# Patient Record
Sex: Male | Born: 1950 | Race: White | Hispanic: No | Smoking: Former smoker
Health system: Southern US, Community
[De-identification: ages and names within clinical notes are randomized; demographics above are authoritative.]

## PROBLEM LIST (undated history)

## (undated) DIAGNOSIS — C801 Malignant (primary) neoplasm, unspecified: Secondary | ICD-10-CM

## (undated) DIAGNOSIS — C61 Malignant neoplasm of prostate: Secondary | ICD-10-CM

## (undated) HISTORY — DX: Malignant neoplasm of prostate: C61

---

## 2010-03-25 ENCOUNTER — Ambulatory Visit: Payer: Self-pay | Admitting: Hematology & Oncology

## 2010-04-11 LAB — CBC WITH DIFFERENTIAL (CANCER CENTER ONLY)
BASO%: 2 % (ref 0.0–2.0)
HCT: 47.3 % (ref 38.7–49.9)
LYMPH%: 37.2 % (ref 14.0–48.0)
MCH: 31.9 pg (ref 28.0–33.4)
MCV: 94 fL (ref 82–98)
MONO#: 0.5 10*3/uL (ref 0.1–0.9)
MONO%: 8.4 % (ref 0.0–13.0)
NEUT%: 47.8 % (ref 40.0–80.0)
Platelets: 122 10*3/uL — ABNORMAL LOW (ref 145–400)
RDW: 13.7 % (ref 10.5–14.6)

## 2010-04-12 LAB — COMPREHENSIVE METABOLIC PANEL
Alkaline Phosphatase: 98 U/L (ref 39–117)
CO2: 26 mEq/L (ref 19–32)
Creatinine, Ser: 0.79 mg/dL (ref 0.40–1.50)
Glucose, Bld: 110 mg/dL — ABNORMAL HIGH (ref 70–99)
Sodium: 140 mEq/L (ref 135–145)
Total Bilirubin: 1.3 mg/dL — ABNORMAL HIGH (ref 0.3–1.2)
Total Protein: 7.3 g/dL (ref 6.0–8.3)

## 2010-04-12 LAB — PSA: PSA: 7.08 ng/mL — ABNORMAL HIGH (ref <=4.00)

## 2010-04-12 LAB — LACTATE DEHYDROGENASE: LDH: 222 U/L (ref 94–250)

## 2010-06-21 ENCOUNTER — Encounter: Payer: Self-pay | Admitting: Hematology & Oncology

## 2010-06-27 ENCOUNTER — Ambulatory Visit: Payer: Self-pay | Admitting: Hematology & Oncology

## 2010-11-27 ENCOUNTER — Other Ambulatory Visit: Payer: Self-pay | Admitting: Hematology & Oncology

## 2010-11-27 ENCOUNTER — Encounter (HOSPITAL_BASED_OUTPATIENT_CLINIC_OR_DEPARTMENT_OTHER): Payer: Self-pay | Admitting: Hematology & Oncology

## 2010-11-27 DIAGNOSIS — C61 Malignant neoplasm of prostate: Secondary | ICD-10-CM

## 2010-12-09 ENCOUNTER — Encounter (HOSPITAL_COMMUNITY)
Admission: RE | Admit: 2010-12-09 | Discharge: 2010-12-09 | Disposition: A | Discharge status: Home / Self Care | Payer: Self-pay | Source: Ambulatory Visit | Attending: Hematology & Oncology | Admitting: Hematology & Oncology

## 2010-12-09 ENCOUNTER — Ambulatory Visit (HOSPITAL_COMMUNITY)
Admission: RE | Admit: 2010-12-09 | Discharge: 2010-12-09 | Disposition: A | Discharge status: Home / Self Care | Payer: Self-pay | Source: Ambulatory Visit | Attending: Hematology & Oncology | Admitting: Hematology & Oncology

## 2010-12-09 DIAGNOSIS — C61 Malignant neoplasm of prostate: Secondary | ICD-10-CM | POA: Insufficient documentation

## 2010-12-11 ENCOUNTER — Encounter (HOSPITAL_BASED_OUTPATIENT_CLINIC_OR_DEPARTMENT_OTHER): Payer: Self-pay

## 2010-12-11 ENCOUNTER — Ambulatory Visit (INDEPENDENT_AMBULATORY_CARE_PROVIDER_SITE_OTHER)
Admission: RE | Admit: 2010-12-11 | Discharge: 2010-12-11 | Disposition: A | Discharge status: Home / Self Care | Payer: Self-pay | Source: Ambulatory Visit | Attending: Hematology & Oncology | Admitting: Hematology & Oncology

## 2010-12-11 ENCOUNTER — Ambulatory Visit (HOSPITAL_BASED_OUTPATIENT_CLINIC_OR_DEPARTMENT_OTHER)
Admission: RE | Admit: 2010-12-11 | Discharge: 2010-12-11 | Disposition: A | Discharge status: Home / Self Care | Payer: Self-pay | Source: Ambulatory Visit | Attending: Hematology & Oncology | Admitting: Hematology & Oncology

## 2010-12-11 DIAGNOSIS — C61 Malignant neoplasm of prostate: Secondary | ICD-10-CM

## 2010-12-11 DIAGNOSIS — C7952 Secondary malignant neoplasm of bone marrow: Secondary | ICD-10-CM

## 2010-12-11 DIAGNOSIS — R599 Enlarged lymph nodes, unspecified: Secondary | ICD-10-CM

## 2010-12-11 DIAGNOSIS — K7689 Other specified diseases of liver: Secondary | ICD-10-CM | POA: Insufficient documentation

## 2010-12-11 DIAGNOSIS — K7460 Unspecified cirrhosis of liver: Secondary | ICD-10-CM | POA: Insufficient documentation

## 2010-12-11 DIAGNOSIS — N62 Hypertrophy of breast: Secondary | ICD-10-CM

## 2010-12-11 DIAGNOSIS — C775 Secondary and unspecified malignant neoplasm of intrapelvic lymph nodes: Secondary | ICD-10-CM

## 2010-12-11 DIAGNOSIS — C801 Malignant (primary) neoplasm, unspecified: Secondary | ICD-10-CM

## 2010-12-11 DIAGNOSIS — K8020 Calculus of gallbladder without cholecystitis without obstruction: Secondary | ICD-10-CM | POA: Insufficient documentation

## 2010-12-11 DIAGNOSIS — Z85038 Personal history of other malignant neoplasm of large intestine: Secondary | ICD-10-CM | POA: Insufficient documentation

## 2010-12-11 DIAGNOSIS — C7951 Secondary malignant neoplasm of bone: Secondary | ICD-10-CM

## 2010-12-11 DIAGNOSIS — Z8546 Personal history of malignant neoplasm of prostate: Secondary | ICD-10-CM | POA: Insufficient documentation

## 2010-12-11 DIAGNOSIS — C772 Secondary and unspecified malignant neoplasm of intra-abdominal lymph nodes: Secondary | ICD-10-CM | POA: Insufficient documentation

## 2010-12-11 HISTORY — DX: Malignant (primary) neoplasm, unspecified: C80.1

## 2010-12-11 MED ORDER — IOHEXOL 300 MG/ML  SOLN
100.0000 mL | Freq: Once | INTRAMUSCULAR | Status: AC | PRN
  Administered 2010-12-11: 100 mL via INTRAVENOUS

## 2010-12-16 ENCOUNTER — Other Ambulatory Visit: Payer: Self-pay | Admitting: Hematology & Oncology

## 2010-12-16 ENCOUNTER — Encounter (HOSPITAL_BASED_OUTPATIENT_CLINIC_OR_DEPARTMENT_OTHER): Payer: Self-pay | Admitting: Hematology & Oncology

## 2010-12-16 DIAGNOSIS — R55 Syncope and collapse: Secondary | ICD-10-CM

## 2010-12-16 DIAGNOSIS — I10 Essential (primary) hypertension: Secondary | ICD-10-CM

## 2010-12-16 DIAGNOSIS — C189 Malignant neoplasm of colon, unspecified: Secondary | ICD-10-CM

## 2010-12-16 DIAGNOSIS — C61 Malignant neoplasm of prostate: Secondary | ICD-10-CM

## 2010-12-16 LAB — CBC WITH DIFFERENTIAL (CANCER CENTER ONLY)
Eosinophils Absolute: 0.3 10*3/uL (ref 0.0–0.5)
LYMPH%: 35.7 % (ref 14.0–48.0)
MCH: 33 pg (ref 28.0–33.4)
MCHC: 37.4 g/dL — ABNORMAL HIGH (ref 32.0–35.9)
MCV: 88 fL (ref 82–98)
MONO%: 12.2 % (ref 0.0–13.0)
Platelets: 113 10*3/uL — ABNORMAL LOW (ref 145–400)
RBC: 4.7 10*6/uL (ref 4.20–5.70)

## 2010-12-16 LAB — COMPREHENSIVE METABOLIC PANEL
Alkaline Phosphatase: 113 U/L (ref 39–117)
BUN: 15 mg/dL (ref 6–23)
Glucose, Bld: 126 mg/dL — ABNORMAL HIGH (ref 70–99)
Sodium: 135 mEq/L (ref 135–145)
Total Bilirubin: 1.1 mg/dL (ref 0.3–1.2)
Total Protein: 7 g/dL (ref 6.0–8.3)

## 2010-12-23 ENCOUNTER — Encounter (HOSPITAL_BASED_OUTPATIENT_CLINIC_OR_DEPARTMENT_OTHER): Payer: Self-pay | Admitting: Hematology & Oncology

## 2010-12-23 DIAGNOSIS — C61 Malignant neoplasm of prostate: Secondary | ICD-10-CM

## 2011-02-06 ENCOUNTER — Other Ambulatory Visit: Payer: Self-pay | Admitting: Hematology & Oncology

## 2011-02-06 ENCOUNTER — Encounter (HOSPITAL_BASED_OUTPATIENT_CLINIC_OR_DEPARTMENT_OTHER): Payer: Self-pay | Admitting: Hematology & Oncology

## 2011-02-06 DIAGNOSIS — C61 Malignant neoplasm of prostate: Secondary | ICD-10-CM

## 2011-02-06 LAB — COMPREHENSIVE METABOLIC PANEL
ALT: 66 U/L — ABNORMAL HIGH (ref 0–53)
Albumin: 3.9 g/dL (ref 3.5–5.2)
CO2: 23 mEq/L (ref 19–32)
Calcium: 8.8 mg/dL (ref 8.4–10.5)
Chloride: 104 mEq/L (ref 96–112)
Creatinine, Ser: 0.87 mg/dL (ref 0.50–1.35)
Potassium: 3.8 mEq/L (ref 3.5–5.3)
Total Protein: 6.7 g/dL (ref 6.0–8.3)

## 2011-02-06 LAB — CBC WITH DIFFERENTIAL (CANCER CENTER ONLY)
Eosinophils Absolute: 0.2 10*3/uL (ref 0.0–0.5)
HCT: 42.5 % (ref 38.7–49.9)
LYMPH%: 36.1 % (ref 14.0–48.0)
MCV: 90 fL (ref 82–98)
MONO#: 0.7 10*3/uL (ref 0.1–0.9)
NEUT%: 48.6 % (ref 40.0–80.0)
RBC: 4.75 10*6/uL (ref 4.20–5.70)
WBC: 6.2 10*3/uL (ref 4.0–10.0)

## 2011-02-06 LAB — LACTATE DEHYDROGENASE: LDH: 229 U/L (ref 94–250)

## 2011-03-06 ENCOUNTER — Other Ambulatory Visit: Payer: Self-pay | Admitting: Hematology & Oncology

## 2011-03-06 ENCOUNTER — Encounter (HOSPITAL_BASED_OUTPATIENT_CLINIC_OR_DEPARTMENT_OTHER): Payer: Self-pay | Admitting: Hematology & Oncology

## 2011-03-06 DIAGNOSIS — R55 Syncope and collapse: Secondary | ICD-10-CM

## 2011-03-06 DIAGNOSIS — C61 Malignant neoplasm of prostate: Secondary | ICD-10-CM

## 2011-03-06 LAB — CMP (CANCER CENTER ONLY)
ALT(SGPT): 38 U/L (ref 10–47)
BUN, Bld: 17 mg/dL (ref 7–22)
CO2: 27 mEq/L (ref 18–33)
Creat: 0.7 mg/dl (ref 0.6–1.2)
Total Bilirubin: 1.1 mg/dl (ref 0.20–1.60)

## 2011-03-06 LAB — CBC WITH DIFFERENTIAL (CANCER CENTER ONLY)
BASO%: 0.7 % (ref 0.0–2.0)
EOS%: 4.5 % (ref 0.0–7.0)
HCT: 41.3 % (ref 38.7–49.9)
LYMPH%: 39 % (ref 14.0–48.0)
MCHC: 37 g/dL — ABNORMAL HIGH (ref 32.0–35.9)
MCV: 90 fL (ref 82–98)
MONO#: 0.8 10*3/uL (ref 0.1–0.9)
NEUT%: 44.8 % (ref 40.0–80.0)
RDW: 13.9 % (ref 11.1–15.7)

## 2011-03-06 LAB — PSA: PSA: 1.05 ng/mL (ref <=4.00)

## 2011-04-22 ENCOUNTER — Encounter: Payer: Self-pay | Admitting: Hematology & Oncology

## 2011-04-22 NOTE — Progress Notes (Signed)
Faxed Patient Application Form to [employer] [phone] for Zometa assistance

## 2011-04-29 ENCOUNTER — Telehealth: Payer: Self-pay | Admitting: *Deleted

## 2011-04-29 NOTE — Telephone Encounter (Signed)
[employer] called asking to clarify dosage of Zometa and instructions and quantity of medication.  Asked him to refax the form to [place] for [place] to fill out.

## 2011-04-30 ENCOUNTER — Encounter: Payer: Self-pay | Admitting: *Deleted

## 2011-05-01 ENCOUNTER — Ambulatory Visit (HOSPITAL_BASED_OUTPATIENT_CLINIC_OR_DEPARTMENT_OTHER): Payer: Self-pay | Admitting: Hematology & Oncology

## 2011-05-01 ENCOUNTER — Encounter: Payer: Self-pay | Admitting: Hematology & Oncology

## 2011-05-01 ENCOUNTER — Other Ambulatory Visit: Payer: Self-pay | Admitting: Hematology & Oncology

## 2011-05-01 ENCOUNTER — Other Ambulatory Visit (HOSPITAL_BASED_OUTPATIENT_CLINIC_OR_DEPARTMENT_OTHER): Payer: Self-pay | Admitting: Lab

## 2011-05-01 VITALS — BP 160/92 | HR 77 | Temp 97.0°F | Ht 69.5 in | Wt 322.0 lb

## 2011-05-01 DIAGNOSIS — C61 Malignant neoplasm of prostate: Secondary | ICD-10-CM | POA: Insufficient documentation

## 2011-05-01 DIAGNOSIS — R55 Syncope and collapse: Secondary | ICD-10-CM

## 2011-05-01 DIAGNOSIS — C801 Malignant (primary) neoplasm, unspecified: Secondary | ICD-10-CM

## 2011-05-01 HISTORY — DX: Malignant neoplasm of prostate: C61

## 2011-05-01 LAB — COMPREHENSIVE METABOLIC PANEL
ALT: 30 U/L (ref 0–53)
Alkaline Phosphatase: 94 U/L (ref 39–117)
Creatinine, Ser: 0.98 mg/dL (ref 0.50–1.35)
Sodium: 143 mEq/L (ref 135–145)
Total Bilirubin: 0.9 mg/dL (ref 0.3–1.2)
Total Protein: 6.6 g/dL (ref 6.0–8.3)

## 2011-05-01 LAB — CBC WITH DIFFERENTIAL (CANCER CENTER ONLY)
BASO%: 0.4 % (ref 0.0–2.0)
EOS%: 3.4 % (ref 0.0–7.0)
LYMPH%: 33.4 % (ref 14.0–48.0)
MCH: 33.1 pg (ref 28.0–33.4)
MCHC: 36.2 g/dL — ABNORMAL HIGH (ref 32.0–35.9)
MONO%: 9.4 % (ref 0.0–13.0)
NEUT#: 3.8 10*3/uL (ref 1.5–6.5)
Platelets: 109 10*3/uL — ABNORMAL LOW (ref 145–400)
RBC: 4.53 10*6/uL (ref 4.20–5.70)
RDW: 13.1 % (ref 11.1–15.7)

## 2011-05-01 LAB — PSA: PSA: 3.22 ng/mL (ref <=4.00)

## 2011-05-01 NOTE — Progress Notes (Signed)
This office note has been dictated.

## 2011-05-01 NOTE — Progress Notes (Signed)
CC:   Matthew Lords, MD  DIAGNOSIS:  Metastatic castrate-resistant prostate cancer.  CURRENT THERAPY: 1. Ketoconazole 200 mg p.o. t.i.d. 2. Hydrocortisone 10 mg p.o. daily.  INTERIM HISTORY:  Matthew Lane comes in for his followup.  He continues to do well on the ketoconazole.  He has had no toxicity from it.  He had a good Thanksgiving last week.  He ate well.  He has not had any bowel or bladder issues.  There has been no pain problems.  He does have some chronic knee issues, but this is more osteoarthritis.  When we last saw him in October, his PSA was 1.05.  He has had no bleeding.  There have been no rashes.  He has had no leg swelling.  There has been no cough or shortness of breath.  He has had no dysphasia or odynophagia.  PHYSICAL EXAM:  General:  This is an obese white gentleman in no obvious distress.  Vital Signs:  Temperature 97.8, pulse 72, respiratory rate 18, blood pressure 140/90.  Weight is 322.  Head/Neck:  Normocephalic, atraumatic skull.  There are no ocular or oral lesions.  There are no palpable cervical, or supraclavicular lymph nodes.  Lungs are clear to percussion and auscultation bilaterally.  Cardiac:  Regular rate and rhythm with a normal S1, S2.  There are no murmurs, rubs or bruits. Abdomen: Soft with good bowel sounds.  There is no palpable abdominal mass.  There is no fluid wave.  There is no palpable hepatosplenomegaly. Back:  No tenderness over the spine, ribs, or hips.  Extremities:  Some trace edema in his lower legs which is chronic.  Neurologic:  No focal neurological deficits.  Skin:  No rashes, ecchymosis or petechiae.  LABORATORY STUDIES:  White blood cell count 7.1, hemoglobin 15, hematocrit 41, platelet count 109.  IMPRESSION/PLAN:  Matthew Lane is a 60 year old gentleman with metastatic prostate cancer.  I would say he is castrate resistant.  We will continue to follow his PSA.  He did have a very nice response to the  ketoconazole.  We continues to be asymptomatic.  I still we can hold off on doing scans on him.  I just do not see that we would be gaining any additional information by doing scans when he is asymptomatic with his PSA so low.  I want to see him back now after the holidays.  I think we can probably get him back in January.    ______________________________ Josph Macho, M.D. PRE/MEDQ  D:  05/01/2011  T:  05/01/2011  Job:  [id number]  ADDENDUM:  PSA is 3.22

## 2011-05-01 NOTE — Progress Notes (Signed)
DDS requested patient's records from Jan. 2011 - present for disability.  Faxed to [phone]

## 2011-05-19 ENCOUNTER — Telehealth: Payer: Self-pay | Admitting: *Deleted

## 2011-05-19 NOTE — Telephone Encounter (Signed)
PSA still low per dr. Myna Hidalgo. Called to patient.

## 2011-06-16 ENCOUNTER — Ambulatory Visit: Payer: Self-pay | Admitting: Hematology & Oncology

## 2011-06-16 ENCOUNTER — Telehealth: Payer: Self-pay | Admitting: Hematology & Oncology

## 2011-06-16 ENCOUNTER — Other Ambulatory Visit: Payer: Self-pay | Admitting: Lab

## 2011-06-16 NOTE — Telephone Encounter (Signed)
Pt called and cx 06/16/11 apt due to not feeling well.  He will back to resch.  Nurse was notified of cx apt

## 2011-06-26 NOTE — Telephone Encounter (Signed)
error 

## 2011-08-26 ENCOUNTER — Telehealth: Payer: Self-pay | Admitting: Hematology & Oncology

## 2011-08-26 NOTE — Telephone Encounter (Signed)
Pt made 09-10-11 appointment

## 2011-09-10 ENCOUNTER — Ambulatory Visit: Payer: Self-pay | Admitting: Hematology & Oncology

## 2011-09-10 ENCOUNTER — Other Ambulatory Visit: Payer: Self-pay | Admitting: Lab

## 2011-10-08 ENCOUNTER — Telehealth: Payer: Self-pay | Admitting: Hematology & Oncology

## 2011-10-08 NOTE — Telephone Encounter (Signed)
Pt called left message needing appointment for next week. I called back left him message to call and schedule appointment

## 2011-10-09 ENCOUNTER — Telehealth: Payer: Self-pay | Admitting: Hematology & Oncology

## 2011-10-09 NOTE — Telephone Encounter (Signed)
Pt made 5-15 appointment

## 2011-10-14 ENCOUNTER — Other Ambulatory Visit (HOSPITAL_BASED_OUTPATIENT_CLINIC_OR_DEPARTMENT_OTHER): Payer: Self-pay | Admitting: Lab

## 2011-10-14 ENCOUNTER — Ambulatory Visit (HOSPITAL_BASED_OUTPATIENT_CLINIC_OR_DEPARTMENT_OTHER): Payer: Self-pay | Admitting: Hematology & Oncology

## 2011-10-14 DIAGNOSIS — C61 Malignant neoplasm of prostate: Secondary | ICD-10-CM

## 2011-10-14 LAB — CBC WITH DIFFERENTIAL (CANCER CENTER ONLY)
Eosinophils Absolute: 0.3 10*3/uL (ref 0.0–0.5)
LYMPH%: 34.6 % (ref 14.0–48.0)
MCV: 92 fL (ref 82–98)
MONO#: 0.6 10*3/uL (ref 0.1–0.9)
NEUT#: 2.7 10*3/uL (ref 1.5–6.5)
Platelets: 91 10*3/uL — ABNORMAL LOW (ref 145–400)
RBC: 4.53 10*6/uL (ref 4.20–5.70)
WBC: 5.5 10*3/uL (ref 4.0–10.0)

## 2011-10-14 NOTE — Progress Notes (Signed)
This office note has been dictated.

## 2011-10-15 ENCOUNTER — Telehealth: Payer: Self-pay | Admitting: Hematology & Oncology

## 2011-10-15 LAB — COMPREHENSIVE METABOLIC PANEL
Albumin: 3.9 g/dL (ref 3.5–5.2)
CO2: 25 mEq/L (ref 19–32)
Calcium: 9.6 mg/dL (ref 8.4–10.5)
Chloride: 105 mEq/L (ref 96–112)
Glucose, Bld: 224 mg/dL — ABNORMAL HIGH (ref 70–99)
Potassium: 4 mEq/L (ref 3.5–5.3)
Sodium: 140 mEq/L (ref 135–145)
Total Protein: 6.2 g/dL (ref 6.0–8.3)

## 2011-10-15 LAB — PSA: PSA: 20.44 ng/mL — ABNORMAL HIGH (ref <=4.00)

## 2011-10-15 NOTE — Progress Notes (Signed)
CC:   Matthew Lords, MD  DIAGNOSIS:  Metastatic castrate-resilient prostate cancer.  CURRENT THERAPY: 1. Ketoconazole 200 mg p.o. t.i.d. 2. 2 hydrocortisone 10 mg p.o. daily.  INTERIM HISTORY:  Matthew Lane comes in for his followup.  He has not been seen since November.  He had a missed, I think a couple of his appointments because of family issues.  When we last saw him, his PSA was 3.22.  He has had no problems with the ketoconazole.  He has had no diarrhea. He has had no abdominal pain.  He has had no bony pain.  His appetite has been doing real good.  He has had no cough.  He has had no swallowing difficulties.  He has had no rashes.  PHYSICAL EXAMINATION:  This is an obese white gentleman in no obvious distress.  Vital signs:  Temperature of 96.9, pulse 77, respiratory rate 18, blood pressure 127/82.  Weight is 327.  Head and neck:  A normocephalic, atraumatic skull.  There are no ocular or oral lesions. There are no palpable cervical or supraclavicular lymph nodes.  Lungs: Clear to percussion and auscultation bilaterally.  Cardiac:  Regular rate and rhythm with a normal S1 and S2.  There are no murmurs, rubs or bruits.  Abdomen:  Soft with good bowel sounds.  There is no palpable abdominal mass.  There is no fluid wave.  There is no palpable hepatosplenomegaly.  Back:  No tenderness over the spine, ribs, or hips. Extremities:  No clubbing, cyanosis or edema.  Neurologic:  No focal neurological deficits.  LABORATORY STUDIES:  White cell count 5.5, hemoglobin 14.8, hematocrit 41.5, platelet count 91,000.  IMPRESSION:  Matthew Lane is a 61 year old gentleman with metastatic prostate cancer.  I would still say there is probably some androgen sensitivity to the prostate cancer.  We will have to see what his PSA is now.  I think that if his PSA is up significantly, then we will probably have to re-stage him.  If he does have obvious disease progression, then I would probably  consider him for Zytiga.  I think the FDA has now approved this for up-front therapy for castrate-resistant prostate cancer.  I definitely cannot wait 6 months to get him back in.  I think we need to get him back in 3 months.    ______________________________ Josph Macho, M.D. PRE/MEDQ  D:  10/14/2011  T:  10/15/2011  Job:  [id number]

## 2011-10-15 NOTE — Telephone Encounter (Signed)
Mailed august schedule °

## 2011-10-19 ENCOUNTER — Other Ambulatory Visit: Payer: Self-pay | Admitting: Hematology & Oncology

## 2011-10-19 DIAGNOSIS — C61 Malignant neoplasm of prostate: Secondary | ICD-10-CM

## 2011-10-20 ENCOUNTER — Telehealth: Payer: Self-pay | Admitting: Hematology & Oncology

## 2011-10-20 NOTE — Telephone Encounter (Signed)
Per in basket called Pt to schedule CT and Bone Scan, pt doesn't have insurance and is self pay. He can't afford scans. I offered financial assistance to patient. He will come in Tuesday next week to fill out form. He said he would bring proof of income with him to speed up the process. MD aware.

## 2011-10-29 ENCOUNTER — Telehealth: Payer: Self-pay | Admitting: Hematology & Oncology

## 2011-10-29 NOTE — Telephone Encounter (Signed)
I called patient he said he didn't have proof of income for this year and that the Social [employer] administration was sending him disability income proof and that he would bring it in when it comes in. So he can fill out the financial assistance form.

## 2011-11-04 ENCOUNTER — Telehealth: Payer: Self-pay | Admitting: Hematology & Oncology

## 2011-11-04 NOTE — Telephone Encounter (Signed)
Left pt message to call and update on financial proof to schedule CT and Bone Scan.

## 2011-11-10 ENCOUNTER — Telehealth: Payer: Self-pay | Admitting: Hematology & Oncology

## 2011-11-10 NOTE — Telephone Encounter (Signed)
Left message for patient to call and let me know if he wants to have the scans since according to [facility] there will be a copay.

## 2011-11-12 ENCOUNTER — Telehealth: Payer: Self-pay | Admitting: Hematology & Oncology

## 2011-11-12 NOTE — Telephone Encounter (Signed)
Pt called is aware of 45%co pay. He said he is applying for medicaid and will call me when that's approved.

## 2011-12-17 ENCOUNTER — Telehealth: Payer: Self-pay | Admitting: Hematology & Oncology

## 2011-12-17 NOTE — Telephone Encounter (Signed)
Pt is still waiting on medicaid before he gets his scans done. He said he would call when he finds out something

## 2011-12-31 ENCOUNTER — Telehealth: Payer: Self-pay | Admitting: Hematology & Oncology

## 2011-12-31 NOTE — Telephone Encounter (Signed)
Patient called and cx 01/01/12 appt and resch for 01/19/12

## 2012-01-01 ENCOUNTER — Other Ambulatory Visit: Payer: Self-pay | Admitting: Lab

## 2012-01-01 ENCOUNTER — Ambulatory Visit: Payer: Self-pay | Admitting: Hematology & Oncology

## 2012-01-18 ENCOUNTER — Telehealth: Payer: Self-pay | Admitting: Hematology & Oncology

## 2012-01-18 NOTE — Telephone Encounter (Signed)
Pt called cx 01-19-12 said is having polyop removed Thursday and will call back to reschedule

## 2012-01-18 NOTE — Telephone Encounter (Signed)
MD aware pt cx 8-20 and is having surgery thursday

## 2012-01-19 ENCOUNTER — Ambulatory Visit: Payer: Self-pay | Admitting: Hematology & Oncology

## 2012-01-19 ENCOUNTER — Other Ambulatory Visit: Payer: Self-pay | Admitting: Lab

## 2012-02-02 ENCOUNTER — Telehealth: Payer: Self-pay | Admitting: Hematology & Oncology

## 2012-02-02 NOTE — Telephone Encounter (Signed)
Patient called and resch 01/19/12 appt to 02/25/12

## 2012-02-25 ENCOUNTER — Ambulatory Visit (HOSPITAL_BASED_OUTPATIENT_CLINIC_OR_DEPARTMENT_OTHER): Payer: Medicaid Other | Admitting: Lab

## 2012-02-25 ENCOUNTER — Ambulatory Visit (HOSPITAL_BASED_OUTPATIENT_CLINIC_OR_DEPARTMENT_OTHER): Payer: Medicaid Other | Admitting: Hematology & Oncology

## 2012-02-25 DIAGNOSIS — C61 Malignant neoplasm of prostate: Secondary | ICD-10-CM

## 2012-02-25 LAB — COMPREHENSIVE METABOLIC PANEL
AST: 35 U/L (ref 0–37)
Albumin: 3.9 g/dL (ref 3.5–5.2)
Alkaline Phosphatase: 120 U/L — ABNORMAL HIGH (ref 39–117)
BUN: 10 mg/dL (ref 6–23)
Potassium: 4 mEq/L (ref 3.5–5.3)
Total Bilirubin: 1.3 mg/dL — ABNORMAL HIGH (ref 0.3–1.2)

## 2012-02-25 LAB — CBC WITH DIFFERENTIAL (CANCER CENTER ONLY)
BASO#: 0 10*3/uL (ref 0.0–0.2)
EOS%: 4.8 % (ref 0.0–7.0)
LYMPH%: 40.6 % (ref 14.0–48.0)
MCH: 32.2 pg (ref 28.0–33.4)
MCHC: 36.3 g/dL — ABNORMAL HIGH (ref 32.0–35.9)
MONO%: 11.7 % (ref 0.0–13.0)
NEUT#: 2.5 10*3/uL (ref 1.5–6.5)
Platelets: 98 10*3/uL — ABNORMAL LOW (ref 145–400)

## 2012-02-25 NOTE — Progress Notes (Signed)
This office note has been dictated.

## 2012-02-25 NOTE — Patient Instructions (Signed)
Call if jproblems

## 2012-03-02 ENCOUNTER — Other Ambulatory Visit: Payer: Self-pay | Admitting: Internal Medicine

## 2012-03-03 NOTE — Progress Notes (Signed)
CC:   [name], MD  DIAGNOSIS:  Metastatic prostate cancer, castrate resilient.  CURRENT THERAPY: 1. Ketoconazole 200 mg p.o. t.i.d. 2. Hydrocortisone 10 mg p.o. daily.  INTERIM HISTORY:  Matthew Lane comes in for followup.  We have not seen him since May.  Again, it is issues, I think, with insurance and also with his family.  Unfortunately, he has been seen by Dr. Sabino Gasser of [facility].  I guess he was having some issues with respect to urinating.  He underwent a TURP.  This is done on August 22nd.  The path report ([id number]) shows a Gleason grade 8 tumor.  This certainly is unfortunate.  I think this did help Matthew Lane with urinating.  We tried to get him set up with scans.  Unfortunately again, insurance issues have been a problem.  His last PSA was up to 20.  He is not complaining of any bony pain.  He has had no nausea or vomiting.  He has had no cough.  He has had no headache.  His appetite has been okay.  He has had no diarrhea.  There has been no leg swelling.  PHYSICAL EXAMINATION:  This is a morbidly obese white gentleman in no obvious distress.  Vital signs:  Temperature of 98.3, pulse 85, respiratory rate 22, blood pressure 132/80.  Weight is 329.  Head and neck:  Normocephalic, atraumatic skull.  There are no ocular or oral lesions.  There are no palpable cervical or supraclavicular lymph nodes. Lungs:  Clear bilaterally.  Cardiac:  Regular rate and rhythm with a normal S1 and S2.  There are no murmurs, rubs or bruits.  Abdominal Exam:  Obese but soft.  He has good bowel sounds.  There is no fluid wave.  No palpable hepatosplenomegaly.  Back:  No tenderness over the spine, ribs, or hips.  Extremities:  No clubbing, cyanosis or edema.  He does have some stasis dermatitis changes in his lower legs.  LABORATORY STUDIES:  White cell count 5.8, hemoglobin 15, hematocrit 41, platelet count 98.  His PSA is 30.  His calcium is 8.9 with  an albumin of 3.9.  IMPRESSION:  Matthew Lane is a 62 year old gentleman with metastatic prostate cancer.  It seems that his cancer is progressing.  His PSA is up further.  He had this localized recurrence.  I think we have to see where he stands with respect to his disease.  We need to set him up with CT and bone scan.  I think we are going to have to get him changed with respect to his treatment.  I just do not see that he has responded to the ketoconazole.  Again, my choice would be Zytiga.  I think we are going to have to also convince him to get on some bone medication.  We could either go with Xgeva or Zometa.  Hopefully, things are settled down for him with respect to his home life and that he will be able to come in to see [place] on a more regular basis.    ______________________________ Matthew Lane, M.D. PRE/MEDQ  D:  03/02/2012  T:  03/03/2012  Job:  [id number]

## 2012-03-08 ENCOUNTER — Encounter (HOSPITAL_COMMUNITY)
Admission: RE | Admit: 2012-03-08 | Discharge: 2012-03-08 | Disposition: A | Discharge status: Home / Self Care | Payer: Medicaid Other | Source: Ambulatory Visit | Attending: Hematology & Oncology | Admitting: Hematology & Oncology

## 2012-03-08 ENCOUNTER — Ambulatory Visit (HOSPITAL_COMMUNITY)
Admission: RE | Admit: 2012-03-08 | Discharge: 2012-03-08 | Disposition: A | Discharge status: Home / Self Care | Payer: Medicaid Other | Source: Ambulatory Visit | Attending: Hematology & Oncology | Admitting: Hematology & Oncology

## 2012-03-08 ENCOUNTER — Other Ambulatory Visit: Payer: Self-pay | Admitting: Hematology & Oncology

## 2012-03-08 DIAGNOSIS — C772 Secondary and unspecified malignant neoplasm of intra-abdominal lymph nodes: Secondary | ICD-10-CM | POA: Insufficient documentation

## 2012-03-08 DIAGNOSIS — R599 Enlarged lymph nodes, unspecified: Secondary | ICD-10-CM | POA: Insufficient documentation

## 2012-03-08 DIAGNOSIS — Z79899 Other long term (current) drug therapy: Secondary | ICD-10-CM | POA: Insufficient documentation

## 2012-03-08 DIAGNOSIS — C7951 Secondary malignant neoplasm of bone: Secondary | ICD-10-CM | POA: Insufficient documentation

## 2012-03-08 DIAGNOSIS — C775 Secondary and unspecified malignant neoplasm of intrapelvic lymph nodes: Secondary | ICD-10-CM | POA: Insufficient documentation

## 2012-03-08 DIAGNOSIS — Z980 Intestinal bypass and anastomosis status: Secondary | ICD-10-CM | POA: Insufficient documentation

## 2012-03-08 DIAGNOSIS — R161 Splenomegaly, not elsewhere classified: Secondary | ICD-10-CM | POA: Insufficient documentation

## 2012-03-08 DIAGNOSIS — C61 Malignant neoplasm of prostate: Secondary | ICD-10-CM | POA: Insufficient documentation

## 2012-03-08 DIAGNOSIS — Z85038 Personal history of other malignant neoplasm of large intestine: Secondary | ICD-10-CM | POA: Insufficient documentation

## 2012-03-08 DIAGNOSIS — K8020 Calculus of gallbladder without cholecystitis without obstruction: Secondary | ICD-10-CM | POA: Insufficient documentation

## 2012-03-08 DIAGNOSIS — I700 Atherosclerosis of aorta: Secondary | ICD-10-CM | POA: Insufficient documentation

## 2012-03-08 DIAGNOSIS — C7952 Secondary malignant neoplasm of bone marrow: Secondary | ICD-10-CM | POA: Insufficient documentation

## 2012-03-08 DIAGNOSIS — Z9049 Acquired absence of other specified parts of digestive tract: Secondary | ICD-10-CM | POA: Insufficient documentation

## 2012-03-08 DIAGNOSIS — N200 Calculus of kidney: Secondary | ICD-10-CM | POA: Insufficient documentation

## 2012-03-08 DIAGNOSIS — R972 Elevated prostate specific antigen [PSA]: Secondary | ICD-10-CM | POA: Insufficient documentation

## 2012-03-08 MED ORDER — IOHEXOL 300 MG/ML  SOLN
100.0000 mL | Freq: Once | INTRAMUSCULAR | Status: AC | PRN
  Administered 2012-03-08: 100 mL via INTRAVENOUS

## 2012-03-08 MED ORDER — TECHNETIUM TC 99M MEDRONATE IV KIT
25.0000 | PACK | Freq: Once | INTRAVENOUS | Status: AC | PRN
  Administered 2012-03-08: 25 via INTRAVENOUS

## 2012-03-28 ENCOUNTER — Encounter: Payer: Self-pay | Admitting: Hematology & Oncology

## 2012-03-28 ENCOUNTER — Ambulatory Visit (HOSPITAL_BASED_OUTPATIENT_CLINIC_OR_DEPARTMENT_OTHER): Payer: Medicaid Other | Admitting: Hematology & Oncology

## 2012-03-28 ENCOUNTER — Ambulatory Visit (HOSPITAL_BASED_OUTPATIENT_CLINIC_OR_DEPARTMENT_OTHER): Payer: Medicaid Other

## 2012-03-28 VITALS — BP 129/61 | HR 74 | Temp 97.9°F | Resp 20 | Ht 69.0 in | Wt 333.0 lb

## 2012-03-28 DIAGNOSIS — C61 Malignant neoplasm of prostate: Secondary | ICD-10-CM

## 2012-03-28 DIAGNOSIS — C7951 Secondary malignant neoplasm of bone: Secondary | ICD-10-CM

## 2012-03-28 DIAGNOSIS — C7952 Secondary malignant neoplasm of bone marrow: Secondary | ICD-10-CM

## 2012-03-28 MED ORDER — SODIUM CHLORIDE 0.9 % IJ SOLN
10.0000 mL | INTRAMUSCULAR | Status: DC | PRN
  Administered 2012-03-28: 10 mL
  Filled 2012-03-28: qty 10

## 2012-03-28 MED ORDER — PREDNISONE 10 MG PO TABS: 10.0000 mg | ORAL_TABLET | Freq: Every day | ORAL | Status: DC

## 2012-03-28 MED ORDER — HEPARIN SOD (PORK) LOCK FLUSH 100 UNIT/ML IV SOLN
500.0000 [IU] | Freq: Once | INTRAVENOUS | Status: AC | PRN
  Administered 2012-03-28: 500 [IU]
  Filled 2012-03-28: qty 5

## 2012-03-28 MED ORDER — ABIRATERONE ACETATE 250 MG PO TABS: 1000.0000 mg | ORAL_TABLET | Freq: Every day | ORAL | Status: DC

## 2012-03-28 MED ORDER — ZOLEDRONIC ACID 4 MG/5ML IV CONC
4.0000 mg | Freq: Once | INTRAVENOUS | Status: AC
  Administered 2012-03-28: 4 mg via INTRAVENOUS
  Filled 2012-03-28: qty 5

## 2012-03-28 MED ORDER — SODIUM CHLORIDE 0.9 % IV SOLN
Freq: Once | INTRAVENOUS | Status: AC
  Administered 2012-03-28: 13:00:00 via INTRAVENOUS

## 2012-03-28 NOTE — Patient Instructions (Signed)
Start Taking:  Zytiga 1000 mg - (4) 250 mg tabs per day on an empty stomach. Do not take with food. No food should be eaten 2 hours before and 1 hour after taking Zytiga.  (see print out for further instructions)  Prednisone 10 mg - 1 tablet daily with food.

## 2012-03-28 NOTE — Patient Instructions (Signed)
Zoledronic Acid injection (Hypercalcemia, Oncology) What is this medicine? ZOLEDRONIC ACID (ZOE le dron ik AS id) lowers the amount of calcium loss from bone. It is used to treat too much calcium in your blood from cancer. It is also used to prevent complications of cancer that has spread to the bone. This medicine may be used for other purposes; ask your health care provider or pharmacist if you have questions. What should I tell my health care provider before I take this medicine? They need to know if you have any of these conditions: -aspirin-sensitive asthma -dental disease -kidney disease -an unusual or allergic reaction to zoledronic acid, other medicines, foods, dyes, or preservatives -pregnant or trying to get pregnant -breast-feeding How should I use this medicine? This medicine is for infusion into a vein. It is given by a health care professional in a hospital or clinic setting. Talk to your pediatrician regarding the use of this medicine in children. Special care may be needed. Overdosage: If you think you have taken too much of this medicine contact a poison control center or emergency room at once. NOTE: This medicine is only for you. Do not share this medicine with others. What if I miss a dose? It is important not to miss your dose. Call your doctor or health care professional if you are unable to keep an appointment. What may interact with this medicine? -certain antibiotics given by injection -NSAIDs, medicines for pain and inflammation, like ibuprofen or naproxen -some diuretics like bumetanide, furosemide -teriparatide -thalidomide This list may not describe all possible interactions. Give your health care provider a list of all the medicines, herbs, non-prescription drugs, or dietary supplements you use. Also tell them if you smoke, drink alcohol, or use illegal drugs. Some items may interact with your medicine. What should I watch for while using this medicine? Visit  your doctor or health care professional for regular checkups. It may be some time before you see the benefit from this medicine. Do not stop taking your medicine unless your doctor tells you to. Your doctor may order blood tests or other tests to see how you are doing. Women should inform their doctor if they wish to become pregnant or think they might be pregnant. There is a potential for serious side effects to an unborn child. Talk to your health care professional or pharmacist for more information. You should make sure that you get enough calcium and vitamin D while you are taking this medicine. Discuss the foods you eat and the vitamins you take with your health care professional. Some people who take this medicine have severe bone, joint, and/or muscle pain. This medicine may also increase your risk for a broken thigh bone. Tell your doctor right away if you have pain in your upper leg or groin. Tell your doctor if you have any pain that does not go away or that gets worse. What side effects may I notice from receiving this medicine? Side effects that you should report to your doctor or health care professional as soon as possible: -allergic reactions like skin rash, itching or hives, swelling of the face, lips, or tongue -anxiety, confusion, or depression -breathing problems -changes in vision -feeling faint or lightheaded, falls -jaw burning, cramping, pain -muscle cramps, stiffness, or weakness -trouble passing urine or change in the amount of urine Side effects that usually do not require medical attention (report to your doctor or health care professional if they continue or are bothersome): -bone, joint, or muscle pain -  fever -hair loss -irritation at site where injected -loss of appetite -nausea, vomiting -stomach upset -tired This list may not describe all possible side effects. Call your doctor for medical advice about side effects. You may report side effects to FDA at  1-800-FDA-1088. Where should I keep my medicine? This drug is given in a hospital or clinic and will not be stored at home. NOTE: This sheet is a summary. It may not cover all possible information. If you have questions about this medicine, talk to your doctor, pharmacist, or health care provider.  2012, Elsevier/[employer] Standard. (11/14/2010 9:06:58 AM) 

## 2012-03-28 NOTE — Progress Notes (Signed)
This office note has been dictated.

## 2012-03-28 NOTE — Addendum Note (Signed)
Addended by: Wynonia Hazard on: 03/28/2012 01:26 PM   Modules accepted: Orders

## 2012-03-29 NOTE — Progress Notes (Signed)
CC:   Matthew Lords, MD  DIAGNOSIS:  Metastatic prostate cancer, castrate-resistant.  CURRENT THERAPY: 1. The patient to start Zytiga 1000 mg p.o. daily/prednisone 10 mg     p.o. daily. 2. Patient to start Zometa 4 mg IV monthly.  INTERIM HISTORY:  Matthew Lane comes in for follow-up.  Unfortunately, he is progressing.  His last PSA was up to 29.  We did go ahead and repeat his scans.  His bone scan does show progressive bone metastasis.  CT of the chest, abdomen, and pelvis shows increasing retroperineal lymphadenopathy.  There is no hepatic metastasis.  He does have some changes consistent with cirrhosis.  There is no pulmonary metastasis.  I think we need to change Matthew Lane over to [place].  He was on ketoconazole which had done well for him.  Of note, he also was seen by Dr. [facility]. He had a TURP because of concerns with local recurrence.  He is having no problems urinating right now.  There is no bleeding. There is no dysuria.  He said he has no cough.  No problems with his bowels.  There is no leg swelling.  PHYSICAL EXAMINATION:  General:  This is a morbidly obese white gentleman in no obvious distress.  Vital signs:  97.8, pulse 74, respiratory rate 20, blood pressure 129/61.  Weight is 333.  Head and neck:  Normocephalic, atraumatic skull.  There are no ocular or oral lesions.  There are no palpable cervical or supraclavicular lymph nodes. Lungs:  Clear bilaterally.  Cardiac:  Regular rate and rhythm with a normal S1 and S2.  There are no murmurs, rubs, or bruits.  Abdomen: Obese, but soft.  He has good bowel sounds.  There is no fluid wave. There is no palpable hepatosplenomegaly.  Back:  No tenderness of the spine, ribs, or hips.  Extremities:  No clubbing, cyanosis, or edema. Skin:  No rash, ecchymosis or petechia.  LABORATORY STUDIES:  Pending.  IMPRESSION:  Matthew Lane is a 61 year old gentleman with metastatic prostate cancer.   Again, he is castrate-resistant.  We are going to see about getting him on Zytiga.  We will go ahead and plan to get him back in 1 month.  We will start his __________ today.  Hopefully, we will see a response with his PSA coming down.    ______________________________ Josph Macho, M.D. PRE/MEDQ  D:  03/28/2012  T:  03/29/2012  Job:  [id number]

## 2012-04-27 ENCOUNTER — Ambulatory Visit (HOSPITAL_BASED_OUTPATIENT_CLINIC_OR_DEPARTMENT_OTHER): Payer: Medicaid Other | Admitting: Hematology & Oncology

## 2012-04-27 ENCOUNTER — Other Ambulatory Visit: Payer: Medicaid Other | Admitting: Lab

## 2012-04-27 ENCOUNTER — Ambulatory Visit (HOSPITAL_BASED_OUTPATIENT_CLINIC_OR_DEPARTMENT_OTHER): Payer: Medicaid Other

## 2012-04-27 ENCOUNTER — Other Ambulatory Visit (HOSPITAL_BASED_OUTPATIENT_CLINIC_OR_DEPARTMENT_OTHER): Payer: Medicaid Other

## 2012-04-27 VITALS — BP 142/71 | HR 71 | Temp 98.5°F | Resp 20 | Ht 69.0 in | Wt 330.0 lb

## 2012-04-27 DIAGNOSIS — C61 Malignant neoplasm of prostate: Secondary | ICD-10-CM

## 2012-04-27 DIAGNOSIS — C7951 Secondary malignant neoplasm of bone: Secondary | ICD-10-CM

## 2012-04-27 LAB — CBC WITH DIFFERENTIAL (CANCER CENTER ONLY)
BASO#: 0 10*3/uL (ref 0.0–0.2)
Eosinophils Absolute: 0.2 10*3/uL (ref 0.0–0.5)
HCT: 41.1 % (ref 38.7–49.9)
HGB: 15.1 g/dL (ref 13.0–17.1)
LYMPH%: 27.7 % (ref 14.0–48.0)
MCH: 32.5 pg (ref 28.0–33.4)
MCV: 88 fL (ref 82–98)
MONO%: 10.2 % (ref 0.0–13.0)
NEUT#: 5.2 10*3/uL (ref 1.5–6.5)
NEUT%: 59.9 % (ref 40.0–80.0)
RBC: 4.65 10*6/uL (ref 4.20–5.70)

## 2012-04-27 LAB — CMP (CANCER CENTER ONLY)
Albumin: 3.7 g/dL (ref 3.3–5.5)
Alkaline Phosphatase: 148 U/L — ABNORMAL HIGH (ref 26–84)
BUN, Bld: 12 mg/dL (ref 7–22)
Creat: 0.8 mg/dl (ref 0.6–1.2)
Glucose, Bld: 104 mg/dL (ref 73–118)
Potassium: 4.1 mEq/L (ref 3.3–4.7)

## 2012-04-27 MED ORDER — SODIUM CHLORIDE 0.9 % IV SOLN
Freq: Once | INTRAVENOUS | Status: AC
  Administered 2012-04-27: 10:00:00 via INTRAVENOUS

## 2012-04-27 MED ORDER — SODIUM CHLORIDE 0.9 % IJ SOLN
10.0000 mL | INTRAMUSCULAR | Status: DC | PRN
  Administered 2012-04-27: 10 mL
  Filled 2012-04-27: qty 10

## 2012-04-27 MED ORDER — ALTEPLASE 2 MG IJ SOLR
2.0000 mg | Freq: Once | INTRAMUSCULAR | Status: DC | PRN
  Filled 2012-04-27: qty 2

## 2012-04-27 MED ORDER — ZOLEDRONIC ACID 4 MG/5ML IV CONC
4.0000 mg | Freq: Once | INTRAVENOUS | Status: AC
  Administered 2012-04-27: 4 mg via INTRAVENOUS
  Filled 2012-04-27: qty 5

## 2012-04-27 MED ORDER — HEPARIN SOD (PORK) LOCK FLUSH 100 UNIT/ML IV SOLN
500.0000 [IU] | Freq: Once | INTRAVENOUS | Status: AC | PRN
  Administered 2012-04-27: 500 [IU]
  Filled 2012-04-27: qty 5

## 2012-04-27 NOTE — Patient Instructions (Signed)
Zoledronic Acid injection (Hypercalcemia, Oncology) What is this medicine? ZOLEDRONIC ACID (ZOE le dron ik AS id) lowers the amount of calcium loss from bone. It is used to treat too much calcium in your blood from cancer. It is also used to prevent complications of cancer that has spread to the bone. This medicine may be used for other purposes; ask your health care provider or pharmacist if you have questions. What should I tell my health care provider before I take this medicine? They need to know if you have any of these conditions: -aspirin-sensitive asthma -dental disease -kidney disease -an unusual or allergic reaction to zoledronic acid, other medicines, foods, dyes, or preservatives -pregnant or trying to get pregnant -breast-feeding How should I use this medicine? This medicine is for infusion into a vein. It is given by a health care professional in a hospital or clinic setting. Talk to your pediatrician regarding the use of this medicine in children. Special care may be needed. Overdosage: If you think you have taken too much of this medicine contact a poison control center or emergency room at once. NOTE: This medicine is only for you. Do not share this medicine with others. What if I miss a dose? It is important not to miss your dose. Call your doctor or health care professional if you are unable to keep an appointment. What may interact with this medicine? -certain antibiotics given by injection -NSAIDs, medicines for pain and inflammation, like ibuprofen or naproxen -some diuretics like bumetanide, furosemide -teriparatide -thalidomide This list may not describe all possible interactions. Give your health care provider a list of all the medicines, herbs, non-prescription drugs, or dietary supplements you use. Also tell them if you smoke, drink alcohol, or use illegal drugs. Some items may interact with your medicine. What should I watch for while using this medicine? Visit  your doctor or health care professional for regular checkups. It may be some time before you see the benefit from this medicine. Do not stop taking your medicine unless your doctor tells you to. Your doctor may order blood tests or other tests to see how you are doing. Women should inform their doctor if they wish to become pregnant or think they might be pregnant. There is a potential for serious side effects to an unborn child. Talk to your health care professional or pharmacist for more information. You should make sure that you get enough calcium and vitamin D while you are taking this medicine. Discuss the foods you eat and the vitamins you take with your health care professional. Some people who take this medicine have severe bone, joint, and/or muscle pain. This medicine may also increase your risk for a broken thigh bone. Tell your doctor right away if you have pain in your upper leg or groin. Tell your doctor if you have any pain that does not go away or that gets worse. What side effects may I notice from receiving this medicine? Side effects that you should report to your doctor or health care professional as soon as possible: -allergic reactions like skin rash, itching or hives, swelling of the face, lips, or tongue -anxiety, confusion, or depression -breathing problems -changes in vision -feeling faint or lightheaded, falls -jaw burning, cramping, pain -muscle cramps, stiffness, or weakness -trouble passing urine or change in the amount of urine Side effects that usually do not require medical attention (report to your doctor or health care professional if they continue or are bothersome): -bone, joint, or muscle pain -  fever -hair loss -irritation at site where injected -loss of appetite -nausea, vomiting -stomach upset -tired This list may not describe all possible side effects. Call your doctor for medical advice about side effects. You may report side effects to FDA at  1-800-FDA-1088. Where should I keep my medicine? This drug is given in a hospital or clinic and will not be stored at home. NOTE: This sheet is a summary. It may not cover all possible information. If you have questions about this medicine, talk to your doctor, pharmacist, or health care provider.  2012, Elsevier/[employer] Standard. (11/14/2010 9:06:58 AM) 

## 2012-04-27 NOTE — Progress Notes (Signed)
This office note has been dictated.

## 2012-04-28 NOTE — Progress Notes (Signed)
CC:   Matthew Lords, MD  DIAGNOSIS:  Metastatic prostate cancer-castrate resistant.  CURRENT THERAPY: 1. Zytiga 1000 mg p.o. daily with prednisone 10 mg p.o. daily. 2. Zometa 4 mg IV monthly.  INTERIM HISTORY:  Matthew Lane comes in for his followup.  He says he feels better.  His bones do not hurt as much.  Hopefully, this may be from the Zometa we gave him.  He has not had any problems with the Zytiga.  There has been no diarrhea.  He has had no nausea or vomiting.  He has had no cough.  When we started his Zytiga, his PSA was 29. physical exam  PHYSICAL EXAMINATION:  General:  This is an obese white gentleman in no obvious distress.  Vital signs:  Temperature of 98.4, pulse 71, respiratory rate 20, blood pressure 142/71.  Weight is 330.  Head and neck:  Normocephalic, atraumatic skull.  There are no ocular or oral lesions.  There are no palpable cervical or supraclavicular lymph nodes. Lungs:  Clear bilaterally.  Cardiac:  Regular rate and rhythm with a normal S1 and S2.  There are no murmurs, rubs, or bruits.  Abdomen: Soft with good bowel sounds.  There is no palpable abdominal mass.  He is obese.  There is no palpable hepatosplenomegaly.  Extremities:  Some mild nonpitting edema of his legs.  There may be some slight stasis dermatitis changes in his legs.  Skin:  No rashes outside of that with his legs.  LABORATORY STUDIES:  White cell count is 8.6, hemoglobin 15, hematocrit 41, platelet count 88.  IMPRESSION:  Matthew Lane is a 61 year old white gentleman with metastatic prostate cancer.  He is castrate resistant.  We have him on Zytiga.  He has been on Zytiga now for almost 2 months.  We will continue with the Zometa monthly.  He says he does get up every morning at 3 o'clock to go to the bathroom. This is when he takes his Zytiga.  Again, we will get him back in 1 month.  We will see how his PSA looks. I think if his PSA is responding, then we do not need to worry  about doing scans on him.   ______________________________ Matthew Lane, M.D. PRE/MEDQ  D:  04/27/2012  T:  04/28/2012  Job:  [id number]

## 2012-05-05 ENCOUNTER — Telehealth: Payer: Self-pay | Admitting: *Deleted

## 2012-05-05 NOTE — Telephone Encounter (Signed)
Message copied by Anselm Jungling on Thu May 05, 2012 10:00 AM ------      Message from: Josph Macho      Created: Tue May 03, 2012  8:36 PM       Call -PSA down to 1.3 from 29!!!!  Fantastic!!!!!  Cindee Lame

## 2012-05-05 NOTE — Telephone Encounter (Signed)
Called patient to let him know that his PSA is down to 1.3 from 29 per dr. Myna Hidalgo

## 2012-05-11 ENCOUNTER — Other Ambulatory Visit: Payer: Self-pay | Admitting: *Deleted

## 2012-05-11 DIAGNOSIS — C61 Malignant neoplasm of prostate: Secondary | ICD-10-CM

## 2012-05-11 MED ORDER — ABIRATERONE ACETATE 250 MG PO TABS: 1000.0000 mg | ORAL_TABLET | Freq: Every day | ORAL | Status: DC

## 2012-05-11 NOTE — Telephone Encounter (Signed)
Pt is needing another rx to be sent off with his pt assistance application for Zytiga.

## 2012-06-08 ENCOUNTER — Ambulatory Visit: Payer: Medicaid Other | Admitting: Medical

## 2012-06-08 ENCOUNTER — Ambulatory Visit: Payer: Medicaid Other

## 2012-06-08 ENCOUNTER — Other Ambulatory Visit: Payer: Medicaid Other | Admitting: Lab

## 2012-06-10 ENCOUNTER — Ambulatory Visit: Payer: Medicaid Other | Admitting: Medical

## 2012-06-10 ENCOUNTER — Ambulatory Visit: Payer: Medicaid Other

## 2012-06-10 ENCOUNTER — Other Ambulatory Visit: Payer: Medicaid Other | Admitting: Lab

## 2012-06-13 ENCOUNTER — Telehealth: Payer: Self-pay | Admitting: Hematology & Oncology

## 2012-06-13 NOTE — Telephone Encounter (Signed)
Left message informing patient of change in time for 06/14/12 appointment.  Asked patient to call back and confirm.  Changed per Tracy's request (scheduling conflict).

## 2012-06-13 NOTE — Telephone Encounter (Signed)
Patient called and resch 06/10/12 cx apt to 06/14/12

## 2012-06-14 ENCOUNTER — Ambulatory Visit: Payer: Medicaid Other

## 2012-06-14 ENCOUNTER — Other Ambulatory Visit: Payer: Medicaid Other | Admitting: Lab

## 2012-06-14 ENCOUNTER — Other Ambulatory Visit (HOSPITAL_BASED_OUTPATIENT_CLINIC_OR_DEPARTMENT_OTHER): Payer: Self-pay | Admitting: Lab

## 2012-06-14 ENCOUNTER — Ambulatory Visit (HOSPITAL_BASED_OUTPATIENT_CLINIC_OR_DEPARTMENT_OTHER): Payer: Self-pay | Admitting: Medical

## 2012-06-14 ENCOUNTER — Ambulatory Visit: Payer: Medicaid Other | Admitting: Medical

## 2012-06-14 ENCOUNTER — Ambulatory Visit (HOSPITAL_BASED_OUTPATIENT_CLINIC_OR_DEPARTMENT_OTHER): Payer: Self-pay

## 2012-06-14 VITALS — BP 139/69 | HR 78 | Temp 98.1°F | Resp 20 | Ht 69.0 in | Wt 329.0 lb

## 2012-06-14 DIAGNOSIS — C7951 Secondary malignant neoplasm of bone: Secondary | ICD-10-CM

## 2012-06-14 DIAGNOSIS — C61 Malignant neoplasm of prostate: Secondary | ICD-10-CM

## 2012-06-14 DIAGNOSIS — C7952 Secondary malignant neoplasm of bone marrow: Secondary | ICD-10-CM

## 2012-06-14 LAB — PSA: PSA: 0.37 ng/mL (ref <=4.00)

## 2012-06-14 LAB — CBC WITH DIFFERENTIAL (CANCER CENTER ONLY)
BASO%: 0.4 % (ref 0.0–2.0)
EOS%: 1.9 % (ref 0.0–7.0)
Eosinophils Absolute: 0.2 10*3/uL (ref 0.0–0.5)
MCH: 32.7 pg (ref 28.0–33.4)
MCHC: 36.2 g/dL — ABNORMAL HIGH (ref 32.0–35.9)
MONO%: 10.9 % (ref 0.0–13.0)
NEUT#: 5 10*3/uL (ref 1.5–6.5)
Platelets: 121 10*3/uL — ABNORMAL LOW (ref 145–400)
RBC: 4.71 10*6/uL (ref 4.20–5.70)
RDW: 13.6 % (ref 11.1–15.7)

## 2012-06-14 LAB — CMP (CANCER CENTER ONLY)
ALT(SGPT): 19 U/L (ref 10–47)
AST: 27 U/L (ref 11–38)
Alkaline Phosphatase: 93 U/L — ABNORMAL HIGH (ref 26–84)
Creat: 0.9 mg/dl (ref 0.6–1.2)
Total Bilirubin: 1.6 mg/dl (ref 0.20–1.60)

## 2012-06-14 LAB — LACTATE DEHYDROGENASE: LDH: 210 U/L (ref 94–250)

## 2012-06-14 MED ORDER — SODIUM CHLORIDE 0.9 % IJ SOLN
10.0000 mL | INTRAMUSCULAR | Status: DC | PRN
  Administered 2012-06-14: 10 mL
  Filled 2012-06-14: qty 10

## 2012-06-14 MED ORDER — ZOLEDRONIC ACID 4 MG/100ML IV SOLN
4.0000 mg | Freq: Once | INTRAVENOUS | Status: AC
  Administered 2012-06-14: 4 mg via INTRAVENOUS
  Filled 2012-06-14: qty 100

## 2012-06-14 MED ORDER — SODIUM CHLORIDE 0.9 % IV SOLN
Freq: Once | INTRAVENOUS | Status: AC
  Administered 2012-06-14: 11:00:00 via INTRAVENOUS

## 2012-06-14 MED ORDER — ALTEPLASE 2 MG IJ SOLR
2.0000 mg | Freq: Once | INTRAMUSCULAR | Status: DC | PRN
  Filled 2012-06-14: qty 2

## 2012-06-14 MED ORDER — HEPARIN SOD (PORK) LOCK FLUSH 100 UNIT/ML IV SOLN
250.0000 [IU] | Freq: Once | INTRAVENOUS | Status: DC | PRN
  Filled 2012-06-14: qty 5

## 2012-06-14 MED ORDER — SODIUM CHLORIDE 0.9 % IJ SOLN
3.0000 mL | Freq: Once | INTRAMUSCULAR | Status: DC | PRN
  Filled 2012-06-14: qty 10

## 2012-06-14 MED ORDER — HEPARIN SOD (PORK) LOCK FLUSH 100 UNIT/ML IV SOLN
500.0000 [IU] | Freq: Once | INTRAVENOUS | Status: AC | PRN
  Administered 2012-06-14: 500 [IU]
  Filled 2012-06-14: qty 5

## 2012-06-14 NOTE — Progress Notes (Signed)
Diagnosis: Metastatic prostate cancer-castrate resistant.  Current therapy: #1    Zytiga 1000 mg by mouth daily with prednisone 10 mg po daily #2.  Zometa 4 mg IV monthly.  Interim history: Mr. Driskill presents today for an office followup visit.  Overall, he, reports, that he is doing quite well.  He is tolerating the Zytiga without any major toxicity.  He, reports, that he feels, good.  He has good energy.  He does not report any excessive fatigue or weakness.  He does not report any nausea, vomiting, diarrhea, constipation.  He does not report any, fevers, chills, or, night sweats.  He's not reporting any cough, chest pain, shortness of breath.  He denies any mouth sores.  He denies any lower leg swelling.  He denies any abdominal pain, or bone pain.  He denies any headaches, visual changes, or rashes.  He denies any hand or foot syndrome.  He denies any skin problems.  He remains on Zometa 4 mg monthly.  Overall, he is able to perform his activities of daily living without any hindrance or decline.  He has had a very nice response.  In terms of his PSA.  His PSA in September, was 29.2.  In November.  It dropped to 1.26.   Review of Systems: Constitutional:Negative for malaise/fatigue, fever, chills, weight loss, diaphoresis, activity change, appetite change, and unexpected weight change.  HEENT: Negative for double vision, blurred vision, visual loss, ear pain, tinnitus, congestion, rhinorrhea, epistaxis sore throat or sinus disease, oral pain/lesion, tongue soreness Respiratory: Negative for cough, chest tightness, shortness of breath, wheezing and stridor.  Cardiovascular: Negative for chest pain, palpitations, leg swelling, orthopnea, PND, DOE or claudication Gastrointestinal: Negative for nausea, vomiting, abdominal pain, diarrhea, constipation, blood in stool, melena, hematochezia, abdominal distention, anal bleeding, rectal pain, anorexia and hematemesis.  Genitourinary: Negative for dysuria,  frequency, hematuria,  Musculoskeletal: Negative for myalgias, back pain, joint swelling, arthralgias and gait problem.  Skin: Negative for rash, color change, pallor and wound.  Neurological:. Negative for dizziness/light-headedness, tremors, seizures, syncope, facial asymmetry, speech difficulty, weakness, numbness, headaches and paresthesias.  Hematological: Negative for adenopathy. Does not bruise/bleed easily.  Psychiatric/Behavioral:  Negative for depression, no loss of interest in normal activity or change in sleep pattern.   Physical Exam: This is a pleasant, obese, 62 year old, white, male, in no obvious distress Vitals: Temperature 98.1 degrees, pulse 70, respirations 20, blood pressure 136/69, weight 329 pounds HEENT reveals a normocephalic, atraumatic skull, no scleral icterus, no oral lesions  Neck is supple without any cervical or supraclavicular adenopathy.  Lungs are clear to auscultation bilaterally. There are no wheezes, rales or rhonci Cardiac is regular rate and rhythm with a normal S1 and S2. There are no murmurs, rubs, or bruits.  Abdomen is soft with good bowel sounds, there is no palpable mass. There is no palpable hepatosplenomegaly. There is no palpable fluid wave.  Musculoskeletal no tenderness of the spine, ribs, or hips.  Extremities there are no clubbing, cyanosis, or edema.  Skin no petechia, purpura or ecchymosis Neurologic is nonfocal.  Laboratory Data: White count 9.9, hemoglobin 15.4, hematocrit 42.6, platelets 121,000  Current Outpatient Prescriptions on File Prior to Visit  Medication Sig Dispense Refill  . abiraterone Acetate (ZYTIGA) 250 MG tablet Take 4 tablets (1,000 mg total) by mouth daily. Take on an empty stomach 1 hour before or 2 hours after a meal  120 tablet  5  . aspirin 81 MG tablet Take 81 mg by mouth daily.      Marland Kitchen  Calcium-Vitamin D 150-100 MG-UNIT TABS Take by mouth every morning.      . Cyanocobalamin (VITAMIN B 12 PO) Take by mouth 2  (two) times daily.      . finasteride (PROSCAR) 5 MG tablet Take 5 mg by mouth daily. Pt not sure of dose      . multivitamin (THERAGRAN) per tablet Take 1 tablet by mouth daily.        . predniSONE (DELTASONE) 10 MG tablet Take 1 tablet (10 mg total) by mouth daily.  30 tablet  2   Assessment/Plan: This is a pleasant, 62 year old, white male,  with the following issues:  #1.  Metastatic prostate cancer-castrate resistant.  He is tolerating the Zytiga extremely well.  He has been on it now for almost 3 months.  His PSA has responded quite nicely.  He will continue on the Zytiga.   #2.  Supportive therapy.  He will continue to receive Zometa 4 mg IV monthly.  He is tolerating the Zometa quite well without any toxicity.  He knows the signs and symptoms to watch for in terms of osteonecrosis of the jaw.  #3.  Followup.  Mr. Larusso will follow back up with [place] in one month, but before then should there be questions or concerns.

## 2012-06-14 NOTE — Patient Instructions (Signed)
Zoledronic Acid injection (Hypercalcemia, Oncology) What is this medicine? ZOLEDRONIC ACID (ZOE le dron ik AS id) lowers the amount of calcium loss from bone. It is used to treat too much calcium in your blood from cancer. It is also used to prevent complications of cancer that has spread to the bone. This medicine may be used for other purposes; ask your health care provider or pharmacist if you have questions. What should I tell my health care provider before I take this medicine? They need to know if you have any of these conditions: -aspirin-sensitive asthma -dental disease -kidney disease -an unusual or allergic reaction to zoledronic acid, other medicines, foods, dyes, or preservatives -pregnant or trying to get pregnant -breast-feeding How should I use this medicine? This medicine is for infusion into a vein. It is given by a health care professional in a hospital or clinic setting. Talk to your pediatrician regarding the use of this medicine in children. Special care may be needed. Overdosage: If you think you have taken too much of this medicine contact a poison control center or emergency room at once. NOTE: This medicine is only for you. Do not share this medicine with others. What if I miss a dose? It is important not to miss your dose. Call your doctor or health care professional if you are unable to keep an appointment. What may interact with this medicine? -certain antibiotics given by injection -NSAIDs, medicines for pain and inflammation, like ibuprofen or naproxen -some diuretics like bumetanide, furosemide -teriparatide -thalidomide This list may not describe all possible interactions. Give your health care provider a list of all the medicines, herbs, non-prescription drugs, or dietary supplements you use. Also tell them if you smoke, drink alcohol, or use illegal drugs. Some items may interact with your medicine. What should I watch for while using this medicine? Visit  your doctor or health care professional for regular checkups. It may be some time before you see the benefit from this medicine. Do not stop taking your medicine unless your doctor tells you to. Your doctor may order blood tests or other tests to see how you are doing. Women should inform their doctor if they wish to become pregnant or think they might be pregnant. There is a potential for serious side effects to an unborn child. Talk to your health care professional or pharmacist for more information. You should make sure that you get enough calcium and vitamin D while you are taking this medicine. Discuss the foods you eat and the vitamins you take with your health care professional. Some people who take this medicine have severe bone, joint, and/or muscle pain. This medicine may also increase your risk for a broken thigh bone. Tell your doctor right away if you have pain in your upper leg or groin. Tell your doctor if you have any pain that does not go away or that gets worse. What side effects may I notice from receiving this medicine? Side effects that you should report to your doctor or health care professional as soon as possible: -allergic reactions like skin rash, itching or hives, swelling of the face, lips, or tongue -anxiety, confusion, or depression -breathing problems -changes in vision -feeling faint or lightheaded, falls -jaw burning, cramping, pain -muscle cramps, stiffness, or weakness -trouble passing urine or change in the amount of urine Side effects that usually do not require medical attention (report to your doctor or health care professional if they continue or are bothersome): -bone, joint, or muscle pain -  fever -hair loss -irritation at site where injected -loss of appetite -nausea, vomiting -stomach upset -tired This list may not describe all possible side effects. Call your doctor for medical advice about side effects. You may report side effects to FDA at  1-800-FDA-1088. Where should I keep my medicine? This drug is given in a hospital or clinic and will not be stored at home. NOTE: This sheet is a summary. It may not cover all possible information. If you have questions about this medicine, talk to your doctor, pharmacist, or health care provider.  2012, Elsevier/[employer] Standard. (11/14/2010 9:06:58 AM) 

## 2012-06-17 ENCOUNTER — Telehealth: Payer: Self-pay | Admitting: Oncology

## 2012-06-17 NOTE — Telephone Encounter (Addendum)
Message copied by Lacie Draft on Fri Jun 17, 2012  1:49 PM ------      Message from: Arlan Organ R      Created: Thu Jun 16, 2012  6:52 PM       Call - PSA is below 1!!!!  Eugenie Birks!!!  Cindee Lame  06/17/2012 Left message on patient's voicemail. Teola Bradley, Rayley Gao [employer]

## 2012-07-11 ENCOUNTER — Ambulatory Visit: Payer: Self-pay

## 2012-07-11 ENCOUNTER — Ambulatory Visit: Payer: Self-pay | Admitting: Medical

## 2012-07-11 ENCOUNTER — Other Ambulatory Visit: Payer: Self-pay | Admitting: Lab

## 2012-07-28 ENCOUNTER — Other Ambulatory Visit: Payer: Self-pay | Admitting: *Deleted

## 2012-07-28 ENCOUNTER — Telehealth: Payer: Self-pay | Admitting: Hematology & Oncology

## 2012-07-28 DIAGNOSIS — C61 Malignant neoplasm of prostate: Secondary | ICD-10-CM

## 2012-07-28 MED ORDER — ABIRATERONE ACETATE 250 MG PO TABS: 1000.0000 mg | ORAL_TABLET | Freq: Every day | ORAL | Status: DC

## 2012-07-28 NOTE — Telephone Encounter (Signed)
Pt called made 3-4 appointment is aware he will see Dr. Darrold Span

## 2012-07-29 ENCOUNTER — Telehealth: Payer: Self-pay | Admitting: Oncology

## 2012-07-29 NOTE — Telephone Encounter (Signed)
Per LL pt to be seen in [place] 3/4 as she will be covering PE that day. S/w pt he is aware of 3/4 appt @ 9:30am and that he will see LL at the [facility] office.

## 2012-08-02 ENCOUNTER — Telehealth: Payer: Self-pay | Admitting: Hematology & Oncology

## 2012-08-02 ENCOUNTER — Ambulatory Visit: Payer: Self-pay

## 2012-08-02 ENCOUNTER — Other Ambulatory Visit: Payer: Self-pay | Admitting: Lab

## 2012-08-02 ENCOUNTER — Ambulatory Visit: Payer: Self-pay | Admitting: Oncology

## 2012-08-02 NOTE — Telephone Encounter (Signed)
Patient called and resch 08/02/12 missed apt to 08/09/12

## 2012-08-04 ENCOUNTER — Telehealth: Payer: Self-pay | Admitting: *Deleted

## 2012-08-08 ENCOUNTER — Other Ambulatory Visit: Payer: Self-pay | Admitting: Medical

## 2012-08-08 DIAGNOSIS — C61 Malignant neoplasm of prostate: Secondary | ICD-10-CM

## 2012-08-09 ENCOUNTER — Other Ambulatory Visit (HOSPITAL_BASED_OUTPATIENT_CLINIC_OR_DEPARTMENT_OTHER): Payer: Self-pay | Admitting: Lab

## 2012-08-09 ENCOUNTER — Ambulatory Visit (HOSPITAL_BASED_OUTPATIENT_CLINIC_OR_DEPARTMENT_OTHER): Payer: Self-pay | Admitting: Medical

## 2012-08-09 ENCOUNTER — Ambulatory Visit (HOSPITAL_BASED_OUTPATIENT_CLINIC_OR_DEPARTMENT_OTHER): Payer: Self-pay

## 2012-08-09 VITALS — BP 112/68 | HR 89 | Temp 97.8°F | Resp 18 | Ht 69.0 in | Wt 330.0 lb

## 2012-08-09 DIAGNOSIS — C61 Malignant neoplasm of prostate: Secondary | ICD-10-CM

## 2012-08-09 DIAGNOSIS — C7951 Secondary malignant neoplasm of bone: Secondary | ICD-10-CM

## 2012-08-09 DIAGNOSIS — C7952 Secondary malignant neoplasm of bone marrow: Secondary | ICD-10-CM

## 2012-08-09 LAB — CBC WITH DIFFERENTIAL (CANCER CENTER ONLY)
BASO#: 0 10*3/uL (ref 0.0–0.2)
Eosinophils Absolute: 0.3 10*3/uL (ref 0.0–0.5)
HCT: 40.4 % (ref 38.7–49.9)
HGB: 14.7 g/dL (ref 13.0–17.1)
MCH: 33.1 pg (ref 28.0–33.4)
MONO%: 10.8 % (ref 0.0–13.0)
NEUT#: 2.6 10*3/uL (ref 1.5–6.5)
RBC: 4.44 10*6/uL (ref 4.20–5.70)

## 2012-08-09 MED ORDER — HEPARIN SOD (PORK) LOCK FLUSH 100 UNIT/ML IV SOLN
250.0000 [IU] | Freq: Once | INTRAVENOUS | Status: DC | PRN
  Filled 2012-08-09: qty 5

## 2012-08-09 MED ORDER — HEPARIN SOD (PORK) LOCK FLUSH 100 UNIT/ML IV SOLN
500.0000 [IU] | Freq: Once | INTRAVENOUS | Status: AC | PRN
  Administered 2012-08-09: 500 [IU]
  Filled 2012-08-09: qty 5

## 2012-08-09 MED ORDER — SODIUM CHLORIDE 0.9 % IJ SOLN
10.0000 mL | INTRAMUSCULAR | Status: DC | PRN
Start: 2012-08-09 — End: 2012-08-09
  Administered 2012-08-09: 10 mL
  Filled 2012-08-09: qty 10

## 2012-08-09 MED ORDER — SODIUM CHLORIDE 0.9 % IJ SOLN
3.0000 mL | Freq: Once | INTRAMUSCULAR | Status: DC | PRN
Start: 2012-08-09 — End: 2012-08-09
  Filled 2012-08-09: qty 10

## 2012-08-09 MED ORDER — SODIUM CHLORIDE 0.9 % IV SOLN
Freq: Once | INTRAVENOUS | Status: AC
  Administered 2012-08-09: 14:00:00 via INTRAVENOUS

## 2012-08-09 MED ORDER — ZOLEDRONIC ACID 4 MG/100ML IV SOLN
4.0000 mg | Freq: Once | INTRAVENOUS | Status: AC
  Administered 2012-08-09: 4 mg via INTRAVENOUS
  Filled 2012-08-09: qty 100

## 2012-08-09 MED ORDER — ALTEPLASE 2 MG IJ SOLR
2.0000 mg | Freq: Once | INTRAMUSCULAR | Status: DC | PRN
  Filled 2012-08-09: qty 2

## 2012-08-09 NOTE — Progress Notes (Signed)
Diagnosis: Metastatic prostate cancer-castrate resistant.  Current therapy: #1    Zytiga 1000 mg by mouth daily with prednisone 10 mg po daily #2.  Zometa 4 mg IV monthly.  Interim history: Mr. Matthew Lane presents today for an office followup visit.  Overall, he, reports, that he is doing quite well.  He is tolerating the Zytiga without any major toxicity.  He was sick with the flu a few weeks ago, and missed some of his Zytiga doses.  He is now back on track.  He, reports, that he feels, good.  He has good energy.  He does not report any excessive fatigue or weakness.  He does not report any nausea, vomiting, diarrhea, constipation.  He does not report any, fevers, chills, or, night sweats.  He's not reporting any cough, chest pain, shortness of breath.  He denies any mouth sores.  He denies any lower leg swelling.  He denies any abdominal pain, or bone pain.  He denies any headaches, visual changes, or rashes.  He denies any hand or foot syndrome.  He denies any skin problems.  He remains on Zometa 4 mg monthly.  Overall, he is able to perform his activities of daily living without any hindrance or decline.  He has had a very nice response.  His most recent PSA in January was 0.37. It continues to decline nicely.   Review of Systems: Constitutional:Negative for malaise/fatigue, fever, chills, weight loss, diaphoresis, activity change, appetite change, and unexpected weight change.  HEENT: Negative for double vision, blurred vision, visual loss, ear pain, tinnitus, congestion, rhinorrhea, epistaxis sore throat or sinus disease, oral pain/lesion, tongue soreness Respiratory: Negative for cough, chest tightness, shortness of breath, wheezing and stridor.  Cardiovascular: Negative for chest pain, palpitations, leg swelling, orthopnea, PND, DOE or claudication Gastrointestinal: Negative for nausea, vomiting, abdominal pain, diarrhea, constipation, blood in stool, melena, hematochezia, abdominal distention, anal  bleeding, rectal pain, anorexia and hematemesis.  Genitourinary: Negative for dysuria, frequency, hematuria,  Musculoskeletal: Negative for myalgias, back pain, joint swelling, arthralgias and gait problem.  Skin: Negative for rash, color change, pallor and wound.  Neurological:. Negative for dizziness/light-headedness, tremors, seizures, syncope, facial asymmetry, speech difficulty, weakness, numbness, headaches and paresthesias.  Hematological: Negative for adenopathy. Does not bruise/bleed easily.  Psychiatric/Behavioral:  Negative for depression, no loss of interest in normal activity or change in sleep pattern.   Physical Exam: This is a pleasant, obese, 62 year old, white, male, in no obvious distress Vitals: Temperature 97.8 degrees, pulse 89, respirations 18, blood pressure 112/68, weight 330 pound HEENT reveals a normocephalic, atraumatic skull, no scleral icterus, no oral lesions  Neck is supple without any cervical or supraclavicular adenopathy.  Lungs are clear to auscultation bilaterally. There are no wheezes, rales or rhonci Cardiac is regular rate and rhythm with a normal S1 and S2. There are no murmurs, rubs, or bruits.  Abdomen is soft with good bowel sounds, there is no palpable mass. There is no palpable hepatosplenomegaly. There is no palpable fluid wave.  Musculoskeletal no tenderness of the spine, ribs, or hips.  Extremities there are no clubbing, cyanosis, or edema.  Skin no petechia, purpura or ecchymosis Neurologic is nonfocal.  Laboratory Data: White count 5.6, hemoglobin 14.7, hematocrit 40.4, platelets 119,000  Current Outpatient Prescriptions on File Prior to Visit  Medication Sig Dispense Refill  . abiraterone Acetate (ZYTIGA) 250 MG tablet Take 4 tablets (1,000 mg total) by mouth daily. Take on an empty stomach 1 hour before or 2 hours after a meal  120 tablet  5  . aspirin 81 MG tablet Take 81 mg by mouth daily.      . Calcium-Vitamin D 150-100 MG-UNIT TABS  Take by mouth every morning.      . Cyanocobalamin (VITAMIN B 12 PO) Take by mouth 2 (two) times daily.      . finasteride (PROSCAR) 5 MG tablet Take 5 mg by mouth daily. Pt not sure of dose      . multivitamin (THERAGRAN) per tablet Take 1 tablet by mouth daily.        . predniSONE (DELTASONE) 10 MG tablet Take 1 tablet (10 mg total) by mouth daily.  30 tablet  2   Assessment/Plan: This is a pleasant, 62 year old, white male,  with the following issues:  #1.  Metastatic prostate cancer-castrate resistant.  He is tolerating the Zytiga extremely well.  He has been on it now for almost 5 months.  His PSA has responded quite nicely.  He will continue on the Zytiga.   #2.  Supportive therapy.  He will continue to receive Zometa 4 mg IV monthly.  He is tolerating the Zometa quite well without any toxicity.  He knows the signs and symptoms to watch for in terms of osteonecrosis of the jaw.  #3.  Followup.  Mr. [name] will follow back up with [place] in one month, but before then should there be questions or concerns.

## 2012-08-09 NOTE — Patient Instructions (Signed)
Zoledronic Acid injection (Hypercalcemia, Oncology) What is this medicine? ZOLEDRONIC ACID (ZOE le dron ik AS id) lowers the amount of calcium loss from bone. It is used to treat too much calcium in your blood from cancer. It is also used to prevent complications of cancer that has spread to the bone. This medicine may be used for other purposes; ask your health care provider or pharmacist if you have questions. What should I tell my health care provider before I take this medicine? They need to know if you have any of these conditions: -aspirin-sensitive asthma -dental disease -kidney disease -an unusual or allergic reaction to zoledronic acid, other medicines, foods, dyes, or preservatives -pregnant or trying to get pregnant -breast-feeding How should I use this medicine? This medicine is for infusion into a vein. It is given by a health care professional in a hospital or clinic setting. Talk to your pediatrician regarding the use of this medicine in children. Special care may be needed. Overdosage: If you think you have taken too much of this medicine contact a poison control center or emergency room at once. NOTE: This medicine is only for you. Do not share this medicine with others. What if I miss a dose? It is important not to miss your dose. Call your doctor or health care professional if you are unable to keep an appointment. What may interact with this medicine? -certain antibiotics given by injection -NSAIDs, medicines for pain and inflammation, like ibuprofen or naproxen -some diuretics like bumetanide, furosemide -teriparatide -thalidomide This list may not describe all possible interactions. Give your health care provider a list of all the medicines, herbs, non-prescription drugs, or dietary supplements you use. Also tell them if you smoke, drink alcohol, or use illegal drugs. Some items may interact with your medicine. What should I watch for while using this medicine? Visit  your doctor or health care professional for regular checkups. It may be some time before you see the benefit from this medicine. Do not stop taking your medicine unless your doctor tells you to. Your doctor may order blood tests or other tests to see how you are doing. Women should inform their doctor if they wish to become pregnant or think they might be pregnant. There is a potential for serious side effects to an unborn child. Talk to your health care professional or pharmacist for more information. You should make sure that you get enough calcium and vitamin D while you are taking this medicine. Discuss the foods you eat and the vitamins you take with your health care professional. Some people who take this medicine have severe bone, joint, and/or muscle pain. This medicine may also increase your risk for a broken thigh bone. Tell your doctor right away if you have pain in your upper leg or groin. Tell your doctor if you have any pain that does not go away or that gets worse. What side effects may I notice from receiving this medicine? Side effects that you should report to your doctor or health care professional as soon as possible: -allergic reactions like skin rash, itching or hives, swelling of the face, lips, or tongue -anxiety, confusion, or depression -breathing problems -changes in vision -feeling faint or lightheaded, falls -jaw burning, cramping, pain -muscle cramps, stiffness, or weakness -trouble passing urine or change in the amount of urine Side effects that usually do not require medical attention (report to your doctor or health care professional if they continue or are bothersome): -bone, joint, or muscle pain -  fever -hair loss -irritation at site where injected -loss of appetite -nausea, vomiting -stomach upset -tired This list may not describe all possible side effects. Call your doctor for medical advice about side effects. You may report side effects to FDA at  1-800-FDA-1088. Where should I keep my medicine? This drug is given in a hospital or clinic and will not be stored at home. NOTE: This sheet is a summary. It may not cover all possible information. If you have questions about this medicine, talk to your doctor, pharmacist, or health care provider.  2012, Elsevier/[employer] Standard. (11/14/2010 9:06:58 AM) 

## 2012-08-10 LAB — COMPREHENSIVE METABOLIC PANEL
Albumin: 3.7 g/dL (ref 3.5–5.2)
Alkaline Phosphatase: 67 U/L (ref 39–117)
BUN: 11 mg/dL (ref 6–23)
Calcium: 9.1 mg/dL (ref 8.4–10.5)
Chloride: 108 mEq/L (ref 96–112)
Glucose, Bld: 264 mg/dL — ABNORMAL HIGH (ref 70–99)
Potassium: 3.9 mEq/L (ref 3.5–5.3)

## 2012-08-18 ENCOUNTER — Telehealth: Payer: Self-pay | Admitting: Hematology & Oncology

## 2012-08-18 NOTE — Telephone Encounter (Signed)
Patient called and cx 09/06/12 and resch for 09/13/12

## 2012-09-06 ENCOUNTER — Ambulatory Visit: Payer: Self-pay

## 2012-09-06 ENCOUNTER — Ambulatory Visit: Payer: Self-pay | Admitting: Medical

## 2012-09-06 ENCOUNTER — Other Ambulatory Visit: Payer: Self-pay | Admitting: Lab

## 2012-09-13 ENCOUNTER — Other Ambulatory Visit (HOSPITAL_BASED_OUTPATIENT_CLINIC_OR_DEPARTMENT_OTHER): Payer: Self-pay | Admitting: Lab

## 2012-09-13 ENCOUNTER — Ambulatory Visit (HOSPITAL_BASED_OUTPATIENT_CLINIC_OR_DEPARTMENT_OTHER): Payer: Self-pay

## 2012-09-13 ENCOUNTER — Ambulatory Visit: Payer: Self-pay

## 2012-09-13 ENCOUNTER — Other Ambulatory Visit: Payer: Self-pay | Admitting: Lab

## 2012-09-13 ENCOUNTER — Ambulatory Visit (HOSPITAL_BASED_OUTPATIENT_CLINIC_OR_DEPARTMENT_OTHER): Payer: Self-pay | Admitting: Hematology & Oncology

## 2012-09-13 VITALS — BP 121/58 | HR 92 | Temp 98.4°F | Resp 18 | Ht 69.0 in | Wt 335.0 lb

## 2012-09-13 DIAGNOSIS — C7951 Secondary malignant neoplasm of bone: Secondary | ICD-10-CM

## 2012-09-13 DIAGNOSIS — C7952 Secondary malignant neoplasm of bone marrow: Secondary | ICD-10-CM

## 2012-09-13 DIAGNOSIS — C61 Malignant neoplasm of prostate: Secondary | ICD-10-CM

## 2012-09-13 LAB — COMPREHENSIVE METABOLIC PANEL
ALT: 22 U/L (ref 0–53)
AST: 29 U/L (ref 0–37)
Albumin: 3.7 g/dL (ref 3.5–5.2)
Alkaline Phosphatase: 76 U/L (ref 39–117)
Chloride: 107 mEq/L (ref 96–112)
Potassium: 3.6 mEq/L (ref 3.5–5.3)
Sodium: 139 mEq/L (ref 135–145)
Total Protein: 6.4 g/dL (ref 6.0–8.3)

## 2012-09-13 LAB — CBC WITH DIFFERENTIAL (CANCER CENTER ONLY)
EOS%: 7.7 % — ABNORMAL HIGH (ref 0.0–7.0)
LYMPH#: 2.1 10*3/uL (ref 0.9–3.3)
MCH: 32.6 pg (ref 28.0–33.4)
MCHC: 35.7 g/dL (ref 32.0–35.9)
MONO%: 11.5 % (ref 0.0–13.0)
NEUT#: 3.1 10*3/uL (ref 1.5–6.5)
Platelets: 90 10*3/uL — ABNORMAL LOW (ref 145–400)
RBC: 4.39 10*6/uL (ref 4.20–5.70)

## 2012-09-13 MED ORDER — ZOLEDRONIC ACID 4 MG/100ML IV SOLN
4.0000 mg | Freq: Once | INTRAVENOUS | Status: AC
  Administered 2012-09-13: 4 mg via INTRAVENOUS
  Filled 2012-09-13: qty 100

## 2012-09-13 MED ORDER — SODIUM CHLORIDE 0.9 % IV SOLN
Freq: Once | INTRAVENOUS | Status: AC
  Administered 2012-09-13: 12:00:00 via INTRAVENOUS

## 2012-09-13 MED ORDER — SODIUM CHLORIDE 0.9 % IJ SOLN
3.0000 mL | Freq: Once | INTRAMUSCULAR | Status: AC | PRN
  Administered 2012-09-13: 3 mL via INTRAVENOUS
  Filled 2012-09-13: qty 10

## 2012-09-13 MED ORDER — HEPARIN SOD (PORK) LOCK FLUSH 100 UNIT/ML IV SOLN
250.0000 [IU] | Freq: Once | INTRAVENOUS | Status: AC | PRN
Start: 2012-09-13 — End: 2012-09-13
  Administered 2012-09-13: 250 [IU]
  Filled 2012-09-13: qty 5

## 2012-09-13 MED ORDER — ALTEPLASE 2 MG IJ SOLR
2.0000 mg | Freq: Once | INTRAMUSCULAR | Status: DC | PRN
  Filled 2012-09-13: qty 2

## 2012-09-13 MED ORDER — ZOLEDRONIC ACID 4 MG/5ML IV CONC: 4.0000 mg | Freq: Once | INTRAVENOUS | Status: DC

## 2012-09-13 NOTE — Patient Instructions (Signed)
Zoledronic Acid injection (Hypercalcemia, Oncology) What is this medicine? ZOLEDRONIC ACID (ZOE le dron ik AS id) lowers the amount of calcium loss from bone. It is used to treat too much calcium in your blood from cancer. It is also used to prevent complications of cancer that has spread to the bone. This medicine may be used for other purposes; ask your health care provider or pharmacist if you have questions. What should I tell my health care provider before I take this medicine? They need to know if you have any of these conditions: -aspirin-sensitive asthma -dental disease -kidney disease -an unusual or allergic reaction to zoledronic acid, other medicines, foods, dyes, or preservatives -pregnant or trying to get pregnant -breast-feeding How should I use this medicine? This medicine is for infusion into a vein. It is given by a health care professional in a hospital or clinic setting. Talk to your pediatrician regarding the use of this medicine in children. Special care may be needed. Overdosage: If you think you have taken too much of this medicine contact a poison control center or emergency room at once. NOTE: This medicine is only for you. Do not share this medicine with others. What if I miss a dose? It is important not to miss your dose. Call your doctor or health care professional if you are unable to keep an appointment. What may interact with this medicine? -certain antibiotics given by injection -NSAIDs, medicines for pain and inflammation, like ibuprofen or naproxen -some diuretics like bumetanide, furosemide -teriparatide -thalidomide This list may not describe all possible interactions. Give your health care provider a list of all the medicines, herbs, non-prescription drugs, or dietary supplements you use. Also tell them if you smoke, drink alcohol, or use illegal drugs. Some items may interact with your medicine. What should I watch for while using this medicine? Visit  your doctor or health care professional for regular checkups. It may be some time before you see the benefit from this medicine. Do not stop taking your medicine unless your doctor tells you to. Your doctor may order blood tests or other tests to see how you are doing. Women should inform their doctor if they wish to become pregnant or think they might be pregnant. There is a potential for serious side effects to an unborn child. Talk to your health care professional or pharmacist for more information. You should make sure that you get enough calcium and vitamin D while you are taking this medicine. Discuss the foods you eat and the vitamins you take with your health care professional. Some people who take this medicine have severe bone, joint, and/or muscle pain. This medicine may also increase your risk for a broken thigh bone. Tell your doctor right away if you have pain in your upper leg or groin. Tell your doctor if you have any pain that does not go away or that gets worse. What side effects may I notice from receiving this medicine? Side effects that you should report to your doctor or health care professional as soon as possible: -allergic reactions like skin rash, itching or hives, swelling of the face, lips, or tongue -anxiety, confusion, or depression -breathing problems -changes in vision -feeling faint or lightheaded, falls -jaw burning, cramping, pain -muscle cramps, stiffness, or weakness -trouble passing urine or change in the amount of urine Side effects that usually do not require medical attention (report to your doctor or health care professional if they continue or are bothersome): -bone, joint, or muscle pain -  fever -hair loss -irritation at site where injected -loss of appetite -nausea, vomiting -stomach upset -tired This list may not describe all possible side effects. Call your doctor for medical advice about side effects. You may report side effects to FDA at  1-800-FDA-1088. Where should I keep my medicine? This drug is given in a hospital or clinic and will not be stored at home. NOTE: This sheet is a summary. It may not cover all possible information. If you have questions about this medicine, talk to your doctor, pharmacist, or health care provider.  2012, Elsevier/[employer] Standard. (11/14/2010 9:06:58 AM) 

## 2012-09-13 NOTE — Progress Notes (Signed)
This office note has been dictated.

## 2012-09-14 NOTE — Progress Notes (Signed)
CC:   Matthew Lords, MD  DIAGNOSIS:  Metastatic prostate cancer, castrate resistant.  CURRENT THERAPY: 1. Zytiga 1000 mg p.o. daily. 2. Prednisone 10 mg p.o. daily. 3. Zometa 4 mg IV monthly.  INTERIM HISTORY:  Matthew Lane comes in for followup.  He is really looking great.  He has responded very nicely to the [place].  His PSA really has come down nicely.  His last PSA was 0.21.  This was back in early March. Before we started the Zytiga, his PSA was about 30.  He has had no nausea and vomiting.  He has had no change in bowel or bladder habits.  There has been no bony pain.  He has had no fevers, sweats, or chills.  There have been no rashes.  Overall, his performance status is ECOG 1.  PHYSICAL EXAMINATION:  General:  This is a morbidly obese white gentleman in no obvious distress.  Vital signs:  Temperature of 98.4, pulse 92, respiratory rate 18, blood pressure 121/58.  Weight is 335. Head and neck:  Normocephalic, atraumatic skull.  There are no ocular or oral lesions.  There are no palpable cervical or supraclavicular lymph nodes.  Lungs:  Clear bilaterally.  Cardiac:  Regular rate and rhythm, with a normal S1 and S2.  There are no murmurs, rubs or bruits. Abdomen:  Soft with good bowel sounds.  There is no palpable abdominal mass.  He is morbidly obese.  There is no fluid wave.  There is no palpable hepatosplenomegaly.  Extremities:  Show chronic stasis dermatitis in his lower legs.  This has actually improved. Neurological:  Shows no focal neurological deficits.  LABORATORY STUDIES:  White cell count is 6.5, hemoglobin 14.3, hematocrit 40.1, platelet count 90,000.  IMPRESSION:  Matthew Lane is a 62 year old gentleman with metastatic prostate cancer.  He is castrate resistant.  He has responded incredibly well to the Zytiga.  We will continue to follow him monthly when he comes in for his Zometa.  I do not see a need for any scans right now as he is totally asymptomatic  and his PSA is quite low.  We will plan to get him back to see [place] in another month.    ______________________________ Matthew Lane, M.D. PRE/MEDQ  D:  09/13/2012  T:  09/14/2012  Job:  [id number]

## 2012-09-22 NOTE — Telephone Encounter (Signed)
Opened in error

## 2012-09-30 ENCOUNTER — Other Ambulatory Visit: Payer: Self-pay | Admitting: *Deleted

## 2012-09-30 DIAGNOSIS — C61 Malignant neoplasm of prostate: Secondary | ICD-10-CM

## 2012-09-30 MED ORDER — PREDNISONE 10 MG PO TABS: 10.0000 mg | ORAL_TABLET | Freq: Every day | ORAL | Status: DC

## 2012-10-11 ENCOUNTER — Other Ambulatory Visit: Payer: Self-pay | Admitting: Lab

## 2012-10-11 ENCOUNTER — Ambulatory Visit: Payer: Self-pay | Admitting: Medical

## 2012-10-11 ENCOUNTER — Ambulatory Visit: Payer: Self-pay

## 2012-10-14 ENCOUNTER — Ambulatory Visit (HOSPITAL_BASED_OUTPATIENT_CLINIC_OR_DEPARTMENT_OTHER): Payer: Self-pay

## 2012-10-14 ENCOUNTER — Other Ambulatory Visit (HOSPITAL_BASED_OUTPATIENT_CLINIC_OR_DEPARTMENT_OTHER): Payer: Self-pay | Admitting: Lab

## 2012-10-14 ENCOUNTER — Ambulatory Visit (HOSPITAL_BASED_OUTPATIENT_CLINIC_OR_DEPARTMENT_OTHER): Payer: Self-pay | Admitting: Medical

## 2012-10-14 VITALS — BP 129/67 | HR 78 | Temp 97.7°F | Resp 18 | Ht 70.0 in | Wt 333.0 lb

## 2012-10-14 DIAGNOSIS — C7951 Secondary malignant neoplasm of bone: Secondary | ICD-10-CM

## 2012-10-14 DIAGNOSIS — C61 Malignant neoplasm of prostate: Secondary | ICD-10-CM

## 2012-10-14 LAB — CBC WITH DIFFERENTIAL (CANCER CENTER ONLY)
BASO#: 0.1 10*3/uL (ref 0.0–0.2)
EOS%: 8.5 % — ABNORMAL HIGH (ref 0.0–7.0)
HCT: 41.6 % (ref 38.7–49.9)
HGB: 14.9 g/dL (ref 13.0–17.1)
LYMPH#: 2.1 10*3/uL (ref 0.9–3.3)
LYMPH%: 37.7 % (ref 14.0–48.0)
MCHC: 35.8 g/dL (ref 32.0–35.9)
MCV: 91 fL (ref 82–98)
MONO#: 0.7 10*3/uL (ref 0.1–0.9)
NEUT%: 40.3 % (ref 40.0–80.0)
RDW: 14.3 % (ref 11.1–15.7)

## 2012-10-14 LAB — CMP (CANCER CENTER ONLY)
BUN, Bld: 6 mg/dL — ABNORMAL LOW (ref 7–22)
CO2: 27 mEq/L (ref 18–33)
Calcium: 9.1 mg/dL (ref 8.0–10.3)
Chloride: 106 mEq/L (ref 98–108)
Creat: 0.7 mg/dl (ref 0.6–1.2)
Total Bilirubin: 2 mg/dl — ABNORMAL HIGH (ref 0.20–1.60)

## 2012-10-14 MED ORDER — POTASSIUM CHLORIDE 10 MEQ/100ML IV SOLN: 10.0000 meq | Freq: Once | INTRAVENOUS | Status: DC

## 2012-10-14 MED ORDER — SODIUM CHLORIDE 0.9 % IV SOLN
Freq: Once | INTRAVENOUS | Status: AC
  Administered 2012-10-14: 10:00:00 via INTRAVENOUS

## 2012-10-14 MED ORDER — SODIUM CHLORIDE 0.9 % IJ SOLN
3.0000 mL | Freq: Once | INTRAMUSCULAR | Status: AC | PRN
  Administered 2012-10-14: 10 mL via INTRAVENOUS
  Filled 2012-10-14: qty 10

## 2012-10-14 MED ORDER — ZOLEDRONIC ACID 4 MG/100ML IV SOLN
4.0000 mg | Freq: Once | INTRAVENOUS | Status: AC
  Administered 2012-10-14: 4 mg via INTRAVENOUS
  Filled 2012-10-14: qty 100

## 2012-10-14 MED ORDER — POTASSIUM CHLORIDE 10 MEQ/100ML IV SOLN
10.0000 meq | Freq: Once | INTRAVENOUS | Status: DC
  Administered 2012-10-14: 40 meq via INTRAVENOUS
  Filled 2012-10-14: qty 100

## 2012-10-14 MED ORDER — HEPARIN SOD (PORK) LOCK FLUSH 100 UNIT/ML IV SOLN
250.0000 [IU] | Freq: Once | INTRAVENOUS | Status: AC | PRN
  Administered 2012-10-14: 500 [IU]
  Filled 2012-10-14: qty 5

## 2012-10-14 MED ORDER — SODIUM CHLORIDE 0.9 % IV SOLN
Freq: Once | INTRAVENOUS | Status: DC
  Filled 2012-10-14: qty 500

## 2012-10-14 NOTE — Patient Instructions (Addendum)
Zoledronic Acid injection (Hypercalcemia, Oncology) What is this medicine? ZOLEDRONIC ACID (ZOE le dron ik AS id) lowers the amount of calcium loss from bone. It is used to treat too much calcium in your blood from cancer. It is also used to prevent complications of cancer that has spread to the bone. This medicine may be used for other purposes; ask your health care provider or pharmacist if you have questions. What should I tell my health care provider before I take this medicine? They need to know if you have any of these conditions: -aspirin-sensitive asthma -dental disease -kidney disease -an unusual or allergic reaction to zoledronic acid, other medicines, foods, dyes, or preservatives -pregnant or trying to get pregnant -breast-feeding How should I use this medicine? This medicine is for infusion into a vein. It is given by a health care professional in a hospital or clinic setting. Talk to your pediatrician regarding the use of this medicine in children. Special care may be needed. Overdosage: If you think you have taken too much of this medicine contact a poison control center or emergency room at once. NOTE: This medicine is only for you. Do not share this medicine with others. What if I miss a dose? It is important not to miss your dose. Call your doctor or health care professional if you are unable to keep an appointment. What may interact with this medicine? -certain antibiotics given by injection -NSAIDs, medicines for pain and inflammation, like ibuprofen or naproxen -some diuretics like bumetanide, furosemide -teriparatide -thalidomide This list may not describe all possible interactions. Give your health care provider a list of all the medicines, herbs, non-prescription drugs, or dietary supplements you use. Also tell them if you smoke, drink alcohol, or use illegal drugs. Some items may interact with your medicine. What should I watch for while using this medicine? Visit  your doctor or health care professional for regular checkups. It may be some time before you see the benefit from this medicine. Do not stop taking your medicine unless your doctor tells you to. Your doctor may order blood tests or other tests to see how you are doing. Women should inform their doctor if they wish to become pregnant or think they might be pregnant. There is a potential for serious side effects to an unborn child. Talk to your health care professional or pharmacist for more information. You should make sure that you get enough calcium and vitamin D while you are taking this medicine. Discuss the foods you eat and the vitamins you take with your health care professional. Some people who take this medicine have severe bone, joint, and/or muscle pain. This medicine may also increase your risk for a broken thigh bone. Tell your doctor right away if you have pain in your upper leg or groin. Tell your doctor if you have any pain that does not go away or that gets worse. What side effects may I notice from receiving this medicine? Side effects that you should report to your doctor or health care professional as soon as possible: -allergic reactions like skin rash, itching or hives, swelling of the face, lips, or tongue -anxiety, confusion, or depression -breathing problems -changes in vision -feeling faint or lightheaded, falls -jaw burning, cramping, pain -muscle cramps, stiffness, or weakness -trouble passing urine or change in the amount of urine Side effects that usually do not require medical attention (report to your doctor or health care professional if they continue or are bothersome): -bone, joint, or muscle pain -  fever -hair loss -irritation at site where injected -loss of appetite -nausea, vomiting -stomach upset -tired This list may not describe all possible side effects. Call your doctor for medical advice about side effects. You may report side effects to FDA at  1-800-FDA-1088. Where should I keep my medicine? This drug is given in a hospital or clinic and will not be stored at home. NOTE: This sheet is a summary. It may not cover all possible information. If you have questions about this medicine, talk to your doctor, pharmacist, or health care provider.  2012, [employer]. (11/14/2010 9:06:58 AM) 

## 2012-10-14 NOTE — Progress Notes (Signed)
DIAGNOSIS:  Metastatic prostate cancer, castrate resistant.  CURRENT THERAPY: 1. Zytiga 1000 mg p.o. daily. 2. Prednisone 10 mg p.o. daily. 3. Zometa 4 mg IV monthly.  INTERIM HISTORY: Matthew Lane presents today for an office followup visit.  Overall he reports that he is doing quite well.  He is responding very nicely to the petechia.  His most recent PSA was 0.19.  This was back in April.  Before we started is at [facility], his PSA was around 30.  He does report some loose stools after he eats, however this is related to his history of colon cancer.  Apparently he had stage I colon cancer that was resected.  He states he only eats one meal a day.  I advised him to take Imodium 30 minutes prior to his meals to see if this helps.  He otherwise reports a good appetite he denies any nausea vomiting chest pain shortness of breath or cough he denies any fevers chills or night sweats he denies any abdominal pain lower leg swelling any obvious or abnormal bleeding he denies any headaches visual changes or rashes.  He denies any hand-foot syndrome.  Overall, his performance status is ECoG 1.  Review of Systems: Constitutional:Negative for malaise/fatigue, fever, chills, weight loss, diaphoresis, activity change, appetite change, and unexpected weight change.  HEENT: Negative for double vision, blurred vision, visual loss, ear pain, tinnitus, congestion, rhinorrhea, epistaxis sore throat or sinus disease, oral pain/lesion, tongue soreness Respiratory: Negative for cough, chest tightness, shortness of breath, wheezing and stridor.  Cardiovascular: Negative for chest pain, palpitations, leg swelling, orthopnea, PND, DOE or claudication Gastrointestinal: Negative for nausea, vomiting, abdominal pain, diarrhea, constipation, blood in stool, melena, hematochezia, abdominal distention, anal bleeding, rectal pain, anorexia and hematemesis.  Genitourinary: Negative for dysuria, frequency, hematuria,  Musculoskeletal:  Negative for myalgias, back pain, joint swelling, arthralgias and gait problem.  Skin: Negative for rash, color change, pallor and wound.  Neurological:. Negative for dizziness/light-headedness, tremors, seizures, syncope, facial asymmetry, speech difficulty, weakness, numbness, headaches and paresthesias.  Hematological: Negative for adenopathy. Does not bruise/bleed easily.  Psychiatric/Behavioral:  Negative for depression, no loss of interest in normal activity or change in sleep pattern.   Physical Exam: This is a morbidly obese 62 year old white male in no obvious distress Vitals: Temperature 97.7 degrees pulse 78 respirations 18 blood pressure 129/67 weight 333 pounds HEENT reveals a normocephalic, atraumatic skull, no scleral icterus, no oral lesions  Neck is supple without any cervical or supraclavicular adenopathy.  Lungs are clear to auscultation bilaterally. There are no wheezes, rales or rhonci Cardiac is regular rate and rhythm with a normal S1 and S2. There are no murmurs, rubs, or bruits.  Abdomen is soft with good bowel sounds, there is no palpable mass. There is no palpable hepatosplenomegaly. There is no palpable fluid wave.  Musculoskeletal no tenderness of the spine, ribs, or hips.  Extremities there are no clubbing, cyanosis, or edema.  Skin no petechia, purpura or ecchymosis Neurologic is nonfocal.  Laboratory Data: White count 5.7 hemoglobin 14.9 hematocrit 41.6 107,000  Current Outpatient Prescriptions on File Prior to Visit  Medication Sig Dispense Refill  . abiraterone Acetate (ZYTIGA) 250 MG tablet Take 4 tablets (1,000 mg total) by mouth daily. Take on an empty stomach 1 hour before or 2 hours after a meal  120 tablet  5  . aspirin 81 MG tablet Take 81 mg by mouth daily.      . Calcium-Vitamin D 150-100 MG-UNIT TABS Take by mouth every  morning.      . Cyanocobalamin (VITAMIN B 12 PO) Take by mouth 2 (two) times daily.      . finasteride (PROSCAR) 5 MG tablet  Take 5 mg by mouth daily. Pt not sure of dose      . multivitamin (THERAGRAN) per tablet Take 1 tablet by mouth daily.        . predniSONE (DELTASONE) 10 MG tablet Take 1 tablet (10 mg total) by mouth daily.  30 tablet  2   No current facility-administered medications on file prior to visit.   Assessment/Plan: This is a pleasant 62 year old white gentleman with the following issues:  #1 metastatic prostate cancer.  He is castrate resistant.  He's responded incredibly well to the Zytiga.  Again, his last PSA in April was 0.19.  He is completely asymptomatic.  #2.  Supportive therapy.  He will remain on Zometa 4 mg IV monthly.  #3.  Followup.  Matthew Lane will follow back up with [place] in one month, but before then should there be questions or concerns.  Addendum: Matthew Lane CMET came back and his potassium is quite low at 2.9.  We will go ahead and give him of IV potassium today.

## 2012-11-10 ENCOUNTER — Ambulatory Visit (HOSPITAL_BASED_OUTPATIENT_CLINIC_OR_DEPARTMENT_OTHER): Payer: Medicare Other

## 2012-11-10 ENCOUNTER — Other Ambulatory Visit: Payer: Self-pay | Admitting: Lab

## 2012-11-10 ENCOUNTER — Other Ambulatory Visit (HOSPITAL_BASED_OUTPATIENT_CLINIC_OR_DEPARTMENT_OTHER): Payer: Medicare Other | Admitting: Lab

## 2012-11-10 ENCOUNTER — Ambulatory Visit (HOSPITAL_BASED_OUTPATIENT_CLINIC_OR_DEPARTMENT_OTHER): Payer: Medicare Other | Admitting: Hematology & Oncology

## 2012-11-10 ENCOUNTER — Ambulatory Visit: Payer: Self-pay

## 2012-11-10 ENCOUNTER — Ambulatory Visit: Payer: Self-pay | Admitting: Hematology & Oncology

## 2012-11-10 VITALS — BP 130/65 | HR 80 | Temp 97.8°F | Resp 18 | Ht 70.0 in | Wt 328.0 lb

## 2012-11-10 DIAGNOSIS — C7951 Secondary malignant neoplasm of bone: Secondary | ICD-10-CM | POA: Diagnosis not present

## 2012-11-10 DIAGNOSIS — C7952 Secondary malignant neoplasm of bone marrow: Secondary | ICD-10-CM | POA: Diagnosis not present

## 2012-11-10 DIAGNOSIS — C61 Malignant neoplasm of prostate: Secondary | ICD-10-CM

## 2012-11-10 DIAGNOSIS — C800 Disseminated malignant neoplasm, unspecified: Secondary | ICD-10-CM | POA: Diagnosis not present

## 2012-11-10 LAB — CBC WITH DIFFERENTIAL (CANCER CENTER ONLY)
BASO#: 0 10*3/uL (ref 0.0–0.2)
Eosinophils Absolute: 0.5 10*3/uL (ref 0.0–0.5)
HGB: 14.1 g/dL (ref 13.0–17.1)
LYMPH#: 2.5 10*3/uL (ref 0.9–3.3)
MONO#: 0.6 10*3/uL (ref 0.1–0.9)
NEUT#: 2.5 10*3/uL (ref 1.5–6.5)
RBC: 4.22 10*6/uL (ref 4.20–5.70)
WBC: 6.1 10*3/uL (ref 4.0–10.0)

## 2012-11-10 LAB — COMPREHENSIVE METABOLIC PANEL
AST: 29 U/L (ref 0–37)
Albumin: 4 g/dL (ref 3.5–5.2)
BUN: 10 mg/dL (ref 6–23)
Calcium: 9.2 mg/dL (ref 8.4–10.5)
Chloride: 107 mEq/L (ref 96–112)
Glucose, Bld: 168 mg/dL — ABNORMAL HIGH (ref 70–99)
Potassium: 3.3 mEq/L — ABNORMAL LOW (ref 3.5–5.3)
Sodium: 143 mEq/L (ref 135–145)
Total Protein: 6.3 g/dL (ref 6.0–8.3)

## 2012-11-10 MED ORDER — ZOLEDRONIC ACID 4 MG/100ML IV SOLN
4.0000 mg | Freq: Once | INTRAVENOUS | Status: AC
  Administered 2012-11-10: 4 mg via INTRAVENOUS
  Filled 2012-11-10: qty 100

## 2012-11-10 NOTE — Progress Notes (Signed)
This office note has been dictated.

## 2012-11-10 NOTE — Patient Instructions (Signed)
Zoledronic Acid injection (Hypercalcemia, Oncology) What is this medicine? ZOLEDRONIC ACID (ZOE le dron ik AS id) lowers the amount of calcium loss from bone. It is used to treat too much calcium in your blood from cancer. It is also used to prevent complications of cancer that has spread to the bone. This medicine may be used for other purposes; ask your health care provider or pharmacist if you have questions. What should I tell my health care provider before I take this medicine? They need to know if you have any of these conditions: -aspirin-sensitive asthma -dental disease -kidney disease -an unusual or allergic reaction to zoledronic acid, other medicines, foods, dyes, or preservatives -pregnant or trying to get pregnant -breast-feeding How should I use this medicine? This medicine is for infusion into a vein. It is given by a health care professional in a hospital or clinic setting. Talk to your pediatrician regarding the use of this medicine in children. Special care may be needed. Overdosage: If you think you have taken too much of this medicine contact a poison control center or emergency room at once. NOTE: This medicine is only for you. Do not share this medicine with others. What if I miss a dose? It is important not to miss your dose. Call your doctor or health care professional if you are unable to keep an appointment. What may interact with this medicine? -certain antibiotics given by injection -NSAIDs, medicines for pain and inflammation, like ibuprofen or naproxen -some diuretics like bumetanide, furosemide -teriparatide -thalidomide This list may not describe all possible interactions. Give your health care provider a list of all the medicines, herbs, non-prescription drugs, or dietary supplements you use. Also tell them if you smoke, drink alcohol, or use illegal drugs. Some items may interact with your medicine. What should I watch for while using this medicine? Visit  your doctor or health care professional for regular checkups. It may be some time before you see the benefit from this medicine. Do not stop taking your medicine unless your doctor tells you to. Your doctor may order blood tests or other tests to see how you are doing. Women should inform their doctor if they wish to become pregnant or think they might be pregnant. There is a potential for serious side effects to an unborn child. Talk to your health care professional or pharmacist for more information. You should make sure that you get enough calcium and vitamin D while you are taking this medicine. Discuss the foods you eat and the vitamins you take with your health care professional. Some people who take this medicine have severe bone, joint, and/or muscle pain. This medicine may also increase your risk for a broken thigh bone. Tell your doctor right away if you have pain in your upper leg or groin. Tell your doctor if you have any pain that does not go away or that gets worse. What side effects may I notice from receiving this medicine? Side effects that you should report to your doctor or health care professional as soon as possible: -allergic reactions like skin rash, itching or hives, swelling of the face, lips, or tongue -anxiety, confusion, or depression -breathing problems -changes in vision -feeling faint or lightheaded, falls -jaw burning, cramping, pain -muscle cramps, stiffness, or weakness -trouble passing urine or change in the amount of urine Side effects that usually do not require medical attention (report to your doctor or health care professional if they continue or are bothersome): -bone, joint, or muscle pain -  fever -hair loss -irritation at site where injected -loss of appetite -nausea, vomiting -stomach upset -tired This list may not describe all possible side effects. Call your doctor for medical advice about side effects. You may report side effects to FDA at  1-800-FDA-1088. Where should I keep my medicine? This drug is given in a hospital or clinic and will not be stored at home. NOTE: This sheet is a summary. It may not cover all possible information. If you have questions about this medicine, talk to your doctor, pharmacist, or health care provider.  2013, [employer]. (11/14/2010 9:06:58 AM)  

## 2012-11-11 NOTE — Progress Notes (Signed)
CC:   Matthew Lords, MD  DIAGNOSIS:  Metastatic prostate cancer, castrate resistant.  CURRENT THERAPY: 1. Zytiga 1000 mg p.o. daily. 2. Prednisone 10 mg p.o. daily. 3. Zometa 4 mg IV q.month.  INTERIM HISTORY:  Matthew Lane comes in for his followup.  He is doing quite well.  He has had no specific complaints since we last saw him. He has had no nausea and vomiting.  He has had no fevers, sweats or chills.  He has had no change in bowel or bladder habits.  He has had no rashes.  His last PSA back in April was 0.19.  PHYSICAL EXAMINATION:  General:  This is an obese white gentleman in no obvious distress.  Vital signs:  Show temperature of 97.9, pulse 80, respiratory rate 18, blood pressure 130/65.  Weight is 328.  Head and neck:  Shows a normocephalic, atraumatic skull.  There are no ocular or oral lesions.  There are no palpable cervical or supraclavicular lymph nodes.  Lungs:  Clear bilaterally.  Cardiac:  Regular rate and rhythm with a normal S1 and S2.  There are no murmurs, rubs or bruits. Abdomen:  Soft with good bowel sounds.  There is no palpable abdominal mass.  There is no fluid wave.  There is no palpable hepatosplenomegaly. Back:  No tenderness over the spine, ribs or hips.  Extremities:  Show no clubbing, cyanosis or edema.  Neurological:  Shows no focal neurological deficits.  LABORATORY STUDIES:  White cell count is 6.1, hemoglobin 14.1, hematocrit 39.1, platelet count 82,000.  IMPRESSION:  Matthew Lane is a 62 year old gentleman with metastatic prostate cancer.  He has done well with Zytiga.  Again, we will have to see what his PSA.  I think that as long as his PSA is low and stable, I do not think we have to put him through any kind of scans.  We will go ahead with Zometa today.  We will try to get him back in another 4-6 weeks.    ______________________________ Josph Macho, M.D. PRE/MEDQ  D:  11/10/2012  T:  11/11/2012  Job:  [id number]

## 2012-11-17 DIAGNOSIS — C61 Malignant neoplasm of prostate: Secondary | ICD-10-CM | POA: Diagnosis not present

## 2012-11-30 DIAGNOSIS — C61 Malignant neoplasm of prostate: Secondary | ICD-10-CM | POA: Diagnosis not present

## 2012-12-08 ENCOUNTER — Ambulatory Visit (HOSPITAL_BASED_OUTPATIENT_CLINIC_OR_DEPARTMENT_OTHER): Payer: Medicare Other | Admitting: Hematology & Oncology

## 2012-12-08 ENCOUNTER — Other Ambulatory Visit (HOSPITAL_BASED_OUTPATIENT_CLINIC_OR_DEPARTMENT_OTHER): Payer: Medicare Other | Admitting: Lab

## 2012-12-08 ENCOUNTER — Ambulatory Visit (HOSPITAL_BASED_OUTPATIENT_CLINIC_OR_DEPARTMENT_OTHER): Payer: Medicare Other

## 2012-12-08 VITALS — BP 135/74 | HR 76 | Temp 98.1°F | Resp 20 | Ht 70.0 in | Wt 322.0 lb

## 2012-12-08 DIAGNOSIS — C800 Disseminated malignant neoplasm, unspecified: Secondary | ICD-10-CM | POA: Diagnosis not present

## 2012-12-08 DIAGNOSIS — C7951 Secondary malignant neoplasm of bone: Secondary | ICD-10-CM | POA: Diagnosis not present

## 2012-12-08 DIAGNOSIS — C61 Malignant neoplasm of prostate: Secondary | ICD-10-CM

## 2012-12-08 DIAGNOSIS — D696 Thrombocytopenia, unspecified: Secondary | ICD-10-CM

## 2012-12-08 LAB — CBC WITH DIFFERENTIAL (CANCER CENTER ONLY)
BASO%: 0.6 % (ref 0.0–2.0)
EOS%: 8.1 % — ABNORMAL HIGH (ref 0.0–7.0)
LYMPH%: 36.4 % (ref 14.0–48.0)
MCHC: 36.1 g/dL — ABNORMAL HIGH (ref 32.0–35.9)
MCV: 93 fL (ref 82–98)
MONO#: 0.5 10*3/uL (ref 0.1–0.9)
NEUT%: 44.9 % (ref 40.0–80.0)
Platelets: 89 10*3/uL — ABNORMAL LOW (ref 145–400)
RDW: 14.1 % (ref 11.1–15.7)
WBC: 5.3 10*3/uL (ref 4.0–10.0)

## 2012-12-08 LAB — CMP (CANCER CENTER ONLY)
ALT(SGPT): 22 U/L (ref 10–47)
AST: 35 U/L (ref 11–38)
Alkaline Phosphatase: 76 U/L (ref 26–84)
CO2: 26 mEq/L (ref 18–33)
Creat: 0.8 mg/dl (ref 0.6–1.2)
Sodium: 137 mEq/L (ref 128–145)
Total Bilirubin: 1.7 mg/dl — ABNORMAL HIGH (ref 0.20–1.60)
Total Protein: 6.8 g/dL (ref 6.4–8.1)

## 2012-12-08 LAB — PSA: PSA: 0.14 ng/mL (ref <=4.00)

## 2012-12-08 MED ORDER — ZOLEDRONIC ACID 4 MG/100ML IV SOLN
4.0000 mg | Freq: Once | INTRAVENOUS | Status: AC
  Administered 2012-12-08: 4 mg via INTRAVENOUS
  Filled 2012-12-08: qty 100

## 2012-12-08 MED ORDER — SODIUM CHLORIDE 0.9 % IJ SOLN
10.0000 mL | INTRAMUSCULAR | Status: DC | PRN
  Administered 2012-12-08: 10 mL via INTRAVENOUS
  Filled 2012-12-08: qty 10

## 2012-12-08 MED ORDER — HEPARIN SOD (PORK) LOCK FLUSH 100 UNIT/ML IV SOLN
500.0000 [IU] | Freq: Once | INTRAVENOUS | Status: AC
  Administered 2012-12-08: 500 [IU] via INTRAVENOUS
  Filled 2012-12-08: qty 5

## 2012-12-08 MED ORDER — SODIUM CHLORIDE 0.9 % IV SOLN
Freq: Once | INTRAVENOUS | Status: AC
  Administered 2012-12-08: 14:00:00 via INTRAVENOUS

## 2012-12-08 NOTE — Patient Instructions (Signed)
Zoledronic Acid injection (Hypercalcemia, Oncology) What is this medicine? ZOLEDRONIC ACID (ZOE le dron ik AS id) lowers the amount of calcium loss from bone. It is used to treat too much calcium in your blood from cancer. It is also used to prevent complications of cancer that has spread to the bone. This medicine may be used for other purposes; ask your health care provider or pharmacist if you have questions. What should I tell my health care provider before I take this medicine? They need to know if you have any of these conditions: -aspirin-sensitive asthma -dental disease -kidney disease -an unusual or allergic reaction to zoledronic acid, other medicines, foods, dyes, or preservatives -pregnant or trying to get pregnant -breast-feeding How should I use this medicine? This medicine is for infusion into a vein. It is given by a health care professional in a hospital or clinic setting. Talk to your pediatrician regarding the use of this medicine in children. Special care may be needed. Overdosage: If you think you have taken too much of this medicine contact a poison control center or emergency room at once. NOTE: This medicine is only for you. Do not share this medicine with others. What if I miss a dose? It is important not to miss your dose. Call your doctor or health care professional if you are unable to keep an appointment. What may interact with this medicine? -certain antibiotics given by injection -NSAIDs, medicines for pain and inflammation, like ibuprofen or naproxen -some diuretics like bumetanide, furosemide -teriparatide -thalidomide This list may not describe all possible interactions. Give your health care provider a list of all the medicines, herbs, non-prescription drugs, or dietary supplements you use. Also tell them if you smoke, drink alcohol, or use illegal drugs. Some items may interact with your medicine. What should I watch for while using this medicine? Visit  your doctor or health care professional for regular checkups. It may be some time before you see the benefit from this medicine. Do not stop taking your medicine unless your doctor tells you to. Your doctor may order blood tests or other tests to see how you are doing. Women should inform their doctor if they wish to become pregnant or think they might be pregnant. There is a potential for serious side effects to an unborn child. Talk to your health care professional or pharmacist for more information. You should make sure that you get enough calcium and vitamin D while you are taking this medicine. Discuss the foods you eat and the vitamins you take with your health care professional. Some people who take this medicine have severe bone, joint, and/or muscle pain. This medicine may also increase your risk for a broken thigh bone. Tell your doctor right away if you have pain in your upper leg or groin. Tell your doctor if you have any pain that does not go away or that gets worse. What side effects may I notice from receiving this medicine? Side effects that you should report to your doctor or health care professional as soon as possible: -allergic reactions like skin rash, itching or hives, swelling of the face, lips, or tongue -anxiety, confusion, or depression -breathing problems -changes in vision -feeling faint or lightheaded, falls -jaw burning, cramping, pain -muscle cramps, stiffness, or weakness -trouble passing urine or change in the amount of urine Side effects that usually do not require medical attention (report to your doctor or health care professional if they continue or are bothersome): -bone, joint, or muscle pain -  fever -hair loss -irritation at site where injected -loss of appetite -nausea, vomiting -stomach upset -tired This list may not describe all possible side effects. Call your doctor for medical advice about side effects. You may report side effects to FDA at  1-800-FDA-1088. Where should I keep my medicine? This drug is given in a hospital or clinic and will not be stored at home. NOTE: This sheet is a summary. It may not cover all possible information. If you have questions about this medicine, talk to your doctor, pharmacist, or health care provider.  2013, [employer]. (11/14/2010 9:06:58 AM)  

## 2012-12-08 NOTE — Progress Notes (Signed)
This office note has been dictated.

## 2012-12-09 ENCOUNTER — Telehealth: Payer: Self-pay | Admitting: Oncology

## 2012-12-09 NOTE — Telephone Encounter (Addendum)
Message copied by Lacie Draft on Fri Dec 09, 2012  5:00 PM ------      Message from: Josph Macho      Created: Fri Dec 09, 2012  3:49 PM       Call - PSA is getting even lower!!!  It is 0.14. Pete ------Spoke with patient.

## 2012-12-09 NOTE — Progress Notes (Signed)
CC:   Matthew Lords, MD  DIAGNOSIS:  Metastatic prostate cancer, castrate-resistant.  CURRENT THERAPY: 1. Zytiga 1000 mg p.o. daily. 2. Prednisone 10 mg p.o. daily. 3. Zometa 4 mg IV monthly.  INTERIM HISTORY:  Matthew Lane comes in for his followup.  He is looking really good.  His PSA is still incredibly low.  It was 0.2 when we checked it back in June.  He has had no problems with bony pain.  He saw Dr. Sabino Gasser, __________ urologist.  He said Dr. Sabino Gasser was very impressed with how well he is doing.  He __________ about a month. Could not feel his prostate at all.  As such, he stopped Matthew Lane Proscar.  Matthew Lane has had no nausea or vomiting.  He has had no diarrhea.  He has had no leg swelling.  He has had no rashes.  PHYSICAL EXAMINATION:  General:  This is an obese white gentleman in no obvious distress.  Vital signs:  Temperature 98.1, pulse 76, respiratory rate 20, blood pressure 135/74.  Weight is 322.  Head and neck: Normocephalic, atraumatic skull.  There are no ocular or oral lesions. There are no palpable cervical or supraclavicular lymph nodes.  Lungs: Clear bilaterally.  Cardiac:  Regular rate and rhythm with a normal S1, S2.  There are no murmurs, rubs or bruits.  Abdomen:  Soft with good bowel sounds.  There is no palpable abdominal mass.  There is no fluid wave.  There is no palpable hepatosplenomegaly.  Back:  No tenderness over the spine, ribs, or hips.  Extremities:  Show no clubbing, cyanosis or edema.  Neurological:  Shows no focal neurological deficits.  LABORATORY STUDIES:  White cell count is 5.3, hemoglobin 14.2, hematocrit 38.3, platelet count 89,000.  IMPRESSION:  Matthew Lane is a nice 62 year old gentleman with metastatic prostate cancer.  His disease is incredibly well controlled right now. He is asymptomatic.  His PSA is low.  His thrombocytopenia is more of a chronic thrombocytopenia.  I suspect that this might be related to him having a  fatty liver.  We will go ahead with Zometa today.  We will plan to get him back in another 4-6 weeks.  We might be able to move his Zometa appointments out a little bit longer.    ______________________________ Josph Macho, M.D. PRE/MEDQ  D:  12/08/2012  T:  12/09/2012  Job:  [id number]

## 2013-01-09 DIAGNOSIS — N200 Calculus of kidney: Secondary | ICD-10-CM | POA: Diagnosis not present

## 2013-01-09 DIAGNOSIS — R319 Hematuria, unspecified: Secondary | ICD-10-CM | POA: Diagnosis not present

## 2013-01-09 DIAGNOSIS — C189 Malignant neoplasm of colon, unspecified: Secondary | ICD-10-CM | POA: Diagnosis not present

## 2013-01-09 DIAGNOSIS — N201 Calculus of ureter: Secondary | ICD-10-CM | POA: Diagnosis not present

## 2013-01-09 DIAGNOSIS — C7952 Secondary malignant neoplasm of bone marrow: Secondary | ICD-10-CM | POA: Diagnosis not present

## 2013-01-09 DIAGNOSIS — R1084 Generalized abdominal pain: Secondary | ICD-10-CM | POA: Diagnosis not present

## 2013-01-19 ENCOUNTER — Ambulatory Visit: Payer: Medicare Other

## 2013-01-19 ENCOUNTER — Other Ambulatory Visit: Payer: Medicare Other | Admitting: Lab

## 2013-01-19 ENCOUNTER — Telehealth: Payer: Self-pay | Admitting: Hematology & Oncology

## 2013-01-19 ENCOUNTER — Ambulatory Visit: Payer: Medicare Other | Admitting: Hematology & Oncology

## 2013-01-19 NOTE — Telephone Encounter (Signed)
Pt called and said he is not going to be able to make this appt today. Will cb later to reschedule

## 2013-02-01 ENCOUNTER — Telehealth: Payer: Self-pay | Admitting: Hematology & Oncology

## 2013-02-01 NOTE — Telephone Encounter (Signed)
Pt left message wanting to schedule appointment. Left pt message to call

## 2013-02-23 ENCOUNTER — Ambulatory Visit: Payer: Medicare Other

## 2013-02-23 ENCOUNTER — Other Ambulatory Visit: Payer: Medicare Other | Admitting: Lab

## 2013-02-23 ENCOUNTER — Ambulatory Visit: Payer: Medicare Other | Admitting: Hematology & Oncology

## 2013-02-23 ENCOUNTER — Telehealth: Payer: Self-pay | Admitting: Hematology & Oncology

## 2013-02-23 NOTE — Progress Notes (Signed)
No treatment today per dr. enenver 

## 2013-02-23 NOTE — Telephone Encounter (Signed)
Pt called made 10-2 appointment from missed today

## 2013-02-27 ENCOUNTER — Telehealth: Payer: Self-pay | Admitting: Hematology & Oncology

## 2013-02-27 NOTE — Telephone Encounter (Signed)
Pt aware moved 10-2 to 11-4 °

## 2013-03-02 ENCOUNTER — Other Ambulatory Visit: Payer: Medicare Other | Admitting: Lab

## 2013-03-02 ENCOUNTER — Ambulatory Visit: Payer: Medicare Other | Admitting: Hematology & Oncology

## 2013-04-04 ENCOUNTER — Ambulatory Visit (HOSPITAL_BASED_OUTPATIENT_CLINIC_OR_DEPARTMENT_OTHER): Payer: Medicare Other | Admitting: Hematology & Oncology

## 2013-04-04 ENCOUNTER — Other Ambulatory Visit (HOSPITAL_BASED_OUTPATIENT_CLINIC_OR_DEPARTMENT_OTHER): Payer: Medicare Other | Admitting: Lab

## 2013-04-04 VITALS — BP 141/73 | HR 71 | Temp 98.2°F | Resp 18 | Ht 70.0 in | Wt 307.0 lb

## 2013-04-04 DIAGNOSIS — C61 Malignant neoplasm of prostate: Secondary | ICD-10-CM

## 2013-04-04 DIAGNOSIS — C7951 Secondary malignant neoplasm of bone: Secondary | ICD-10-CM | POA: Diagnosis not present

## 2013-04-04 DIAGNOSIS — R197 Diarrhea, unspecified: Secondary | ICD-10-CM

## 2013-04-04 LAB — CBC WITH DIFFERENTIAL (CANCER CENTER ONLY)
BASO%: 0.7 % (ref 0.0–2.0)
Eosinophils Absolute: 0.5 10*3/uL (ref 0.0–0.5)
HCT: 37.3 % — ABNORMAL LOW (ref 38.7–49.9)
LYMPH#: 2 10*3/uL (ref 0.9–3.3)
MCV: 92 fL (ref 82–98)
MONO#: 0.7 10*3/uL (ref 0.1–0.9)
NEUT%: 46.5 % (ref 40.0–80.0)
RBC: 4.05 10*6/uL — ABNORMAL LOW (ref 4.20–5.70)
RDW: 14 % (ref 11.1–15.7)
WBC: 6 10*3/uL (ref 4.0–10.0)

## 2013-04-04 MED ORDER — DIPHENOXYLATE-ATROPINE 2.5-0.025 MG PO TABS: 2.0000 | ORAL_TABLET | Freq: Four times a day (QID) | ORAL | Status: DC | PRN

## 2013-04-04 NOTE — Progress Notes (Signed)
This office note has been dictated.

## 2013-04-05 LAB — COMPREHENSIVE METABOLIC PANEL
AST: 30 U/L (ref 0–37)
Albumin: 3.5 g/dL (ref 3.5–5.2)
Alkaline Phosphatase: 86 U/L (ref 39–117)
Chloride: 107 mEq/L (ref 96–112)
Glucose, Bld: 118 mg/dL — ABNORMAL HIGH (ref 70–99)
Potassium: 4 mEq/L (ref 3.5–5.3)
Sodium: 139 mEq/L (ref 135–145)
Total Protein: 6.4 g/dL (ref 6.0–8.3)

## 2013-04-05 LAB — PSA: PSA: 0.23 ng/mL (ref <=4.00)

## 2013-04-05 NOTE — Progress Notes (Signed)
CC:   [name], MD  DIAGNOSIS:  Metastatic prostate cancer, castrate resistant.  CURRENT THERAPY: 1. Zytiga 1000 mg p.o. daily, patient took himself off this a month     ago. 2. Prednisone 10 mg p.o. q. day. 3. Zometa 4 mg IV q. month.  INTERIM HISTORY:  Mr. Hanssen comes in for followup.  Unfortunately, he took himself off Zytiga about a month ago.  He said he was having some diarrhea issues.  He is on Imodium, which he said did not make him feel that good.  I will go ahead and try some Lomotil on him.  We will get him back on Zometa.  We will get him on half a dose for the first week.  If there is no diarrhea, then we will go up to 3/4 dose for 2 weeks.  If there is still no diarrhea, then we can go full dose.  His last PSA, which was done in July, was down to 0.14.  He now has a girlfriend.  He is happy about this.  This is making his life a whole lot better.  He has had a good appetite.  He has had no cough.  He has had no bleeding.  He has had no headache.  PHYSICAL EXAMINATION:  General:  This is a well-developed, well- nourished white gentleman, who actually is somewhat obese.  Vital Signs: Temperature of 98.2, pulse 72, respiratory rate 18, blood pressure 141/73, weight is 307 pounds.  Head and Neck:  Normocephalic, atraumatic skull.  He has no ocular or oral lesions.  Neck:  There are no palpable cervical or supraclavicular lymph nodes.  Lungs:  Clear bilaterally. Cardiac:  Regular rate and rhythm with normal S1 and S2.  There are no murmurs, rubs, or bruits.  Abdomen:  Soft.  He has good bowel sounds. He is somewhat obese.  There is no fluid wave.  There is no palpable hepatosplenomegaly.  Extremities:  No clubbing, cyanosis, or edema.  He has good range motion of his joints.  Skin:  No rashes, ecchymosis, or petechia.  Neurological:  No focal neurological deficits.  LABORATORY STUDIES:  White cell count 6, hemoglobin 13.1, hematocrit 37.3, platelet count  117,000.  IMPRESSION:  Mr. Vizcarrondo is a 62 year old gentleman with metastatic prostate cancer.  He has done very, very nicely.  He is pretty much asymptomatic with this.  We will get him back on the Zytiga.  Hopefully, he will be able to tolerate this.  I gave him a prescription for Lomotil.  We will go ahead and give him Zometa next week.  I want to see him back in about 6 weeks.    ______________________________ Josph Macho, M.D. PRE/MEDQ  D:  04/04/2013  T:  04/05/2013  Job:  [id number]

## 2013-04-07 ENCOUNTER — Telehealth: Payer: Self-pay | Admitting: *Deleted

## 2013-04-07 NOTE — Telephone Encounter (Addendum)
Message copied by Wynonia Hazard on Fri Apr 07, 2013 11:45 AM ------      Message from: Arlan Organ R      Created: Wed Apr 05, 2013  6:30 PM       Call - PSA is up just a little!! This is not bad!!  Matthew Lane ------  Pt called inquiring about his PSA. Gave him the results as stated above with verbalized understanding.

## 2013-04-10 ENCOUNTER — Ambulatory Visit (HOSPITAL_BASED_OUTPATIENT_CLINIC_OR_DEPARTMENT_OTHER): Payer: Medicare Other

## 2013-04-10 VITALS — BP 129/78 | HR 71 | Temp 96.9°F | Resp 18

## 2013-04-10 DIAGNOSIS — C7951 Secondary malignant neoplasm of bone: Secondary | ICD-10-CM | POA: Diagnosis not present

## 2013-04-10 DIAGNOSIS — C61 Malignant neoplasm of prostate: Secondary | ICD-10-CM | POA: Diagnosis not present

## 2013-04-10 MED ORDER — SODIUM CHLORIDE 0.9 % IJ SOLN
10.0000 mL | INTRAMUSCULAR | Status: DC | PRN
  Administered 2013-04-10: 10 mL via INTRAVENOUS
  Filled 2013-04-10: qty 10

## 2013-04-10 MED ORDER — ZOLEDRONIC ACID 4 MG/100ML IV SOLN
4.0000 mg | Freq: Once | INTRAVENOUS | Status: AC
  Administered 2013-04-10: 4 mg via INTRAVENOUS
  Filled 2013-04-10: qty 100

## 2013-04-10 MED ORDER — HEPARIN SOD (PORK) LOCK FLUSH 100 UNIT/ML IV SOLN
500.0000 [IU] | Freq: Once | INTRAVENOUS | Status: AC
  Administered 2013-04-10: 500 [IU] via INTRAVENOUS
  Filled 2013-04-10: qty 5

## 2013-04-10 MED ORDER — SODIUM CHLORIDE 0.9 % IV SOLN
Freq: Once | INTRAVENOUS | Status: AC
  Administered 2013-04-10: 14:00:00 via INTRAVENOUS

## 2013-04-10 NOTE — Patient Instructions (Signed)

## 2013-05-24 DIAGNOSIS — C61 Malignant neoplasm of prostate: Secondary | ICD-10-CM | POA: Diagnosis not present

## 2013-05-30 DIAGNOSIS — C187 Malignant neoplasm of sigmoid colon: Secondary | ICD-10-CM | POA: Diagnosis not present

## 2013-06-05 ENCOUNTER — Ambulatory Visit: Payer: Medicare Other

## 2013-06-05 ENCOUNTER — Ambulatory Visit: Payer: Medicare Other | Admitting: Hematology & Oncology

## 2013-06-05 ENCOUNTER — Other Ambulatory Visit: Payer: Medicare Other | Admitting: Lab

## 2013-06-05 ENCOUNTER — Telehealth: Payer: Self-pay | Admitting: Hematology & Oncology

## 2013-06-05 NOTE — Telephone Encounter (Signed)
Pt made 1-9 appointment

## 2013-06-09 ENCOUNTER — Other Ambulatory Visit: Payer: Medicare Other | Admitting: Lab

## 2013-06-09 ENCOUNTER — Ambulatory Visit: Payer: Medicare Other | Admitting: Hematology & Oncology

## 2013-06-09 ENCOUNTER — Telehealth: Payer: Self-pay | Admitting: Hematology & Oncology

## 2013-06-09 ENCOUNTER — Ambulatory Visit: Payer: Medicare Other

## 2013-06-09 NOTE — Telephone Encounter (Signed)
Patient called and cx 06/09/13 apt due to being sick.  He will call back to resch

## 2014-03-19 DIAGNOSIS — R319 Hematuria, unspecified: Secondary | ICD-10-CM | POA: Diagnosis not present

## 2014-03-19 DIAGNOSIS — N390 Urinary tract infection, site not specified: Secondary | ICD-10-CM | POA: Diagnosis not present

## 2014-03-19 DIAGNOSIS — C61 Malignant neoplasm of prostate: Secondary | ICD-10-CM | POA: Diagnosis not present

## 2014-03-28 DIAGNOSIS — C61 Malignant neoplasm of prostate: Secondary | ICD-10-CM | POA: Diagnosis not present

## 2014-04-05 DIAGNOSIS — C61 Malignant neoplasm of prostate: Secondary | ICD-10-CM | POA: Diagnosis not present

## 2014-05-03 DIAGNOSIS — N201 Calculus of ureter: Secondary | ICD-10-CM | POA: Diagnosis not present

## 2014-05-03 DIAGNOSIS — E319 Polyglandular dysfunction, unspecified: Secondary | ICD-10-CM | POA: Diagnosis not present

## 2014-05-03 DIAGNOSIS — C61 Malignant neoplasm of prostate: Secondary | ICD-10-CM | POA: Diagnosis not present

## 2014-05-09 DIAGNOSIS — C61 Malignant neoplasm of prostate: Secondary | ICD-10-CM | POA: Diagnosis not present

## 2014-05-11 ENCOUNTER — Telehealth: Payer: Self-pay | Admitting: Hematology & Oncology

## 2014-05-11 NOTE — Telephone Encounter (Signed)
Pt called made 1-11 appointment

## 2014-06-08 ENCOUNTER — Other Ambulatory Visit: Payer: Self-pay | Admitting: Nurse Practitioner

## 2014-06-08 DIAGNOSIS — C61 Malignant neoplasm of prostate: Secondary | ICD-10-CM

## 2014-06-11 ENCOUNTER — Ambulatory Visit (HOSPITAL_BASED_OUTPATIENT_CLINIC_OR_DEPARTMENT_OTHER): Payer: Medicare Other | Admitting: Hematology & Oncology

## 2014-06-11 ENCOUNTER — Encounter: Payer: Self-pay | Admitting: Hematology & Oncology

## 2014-06-11 ENCOUNTER — Other Ambulatory Visit (HOSPITAL_BASED_OUTPATIENT_CLINIC_OR_DEPARTMENT_OTHER): Payer: Medicare Other | Admitting: Lab

## 2014-06-11 VITALS — BP 127/66 | HR 66 | Temp 97.6°F | Resp 20 | Ht 69.0 in | Wt 337.0 lb

## 2014-06-11 DIAGNOSIS — C7951 Secondary malignant neoplasm of bone: Secondary | ICD-10-CM | POA: Diagnosis not present

## 2014-06-11 DIAGNOSIS — C61 Malignant neoplasm of prostate: Secondary | ICD-10-CM

## 2014-06-11 DIAGNOSIS — C800 Disseminated malignant neoplasm, unspecified: Secondary | ICD-10-CM | POA: Diagnosis not present

## 2014-06-11 LAB — CBC WITH DIFFERENTIAL (CANCER CENTER ONLY)
BASO#: 0.1 10*3/uL (ref 0.0–0.2)
BASO%: 1 % (ref 0.0–2.0)
EOS ABS: 0.3 10*3/uL (ref 0.0–0.5)
EOS%: 6.4 % (ref 0.0–7.0)
HCT: 41.2 % (ref 38.7–49.9)
HEMOGLOBIN: 14.6 g/dL (ref 13.0–17.1)
LYMPH#: 1.8 10*3/uL (ref 0.9–3.3)
LYMPH%: 37.7 % (ref 14.0–48.0)
MCH: 32.4 pg (ref 28.0–33.4)
MCHC: 35.4 g/dL (ref 32.0–35.9)
MCV: 91 fL (ref 82–98)
MONO#: 0.4 10*3/uL (ref 0.1–0.9)
MONO%: 8.6 % (ref 0.0–13.0)
NEUT%: 46.3 % (ref 40.0–80.0)
NEUTROS ABS: 2.3 10*3/uL (ref 1.5–6.5)
Platelets: 84 10*3/uL — ABNORMAL LOW (ref 145–400)
RBC: 4.51 10*6/uL (ref 4.20–5.70)
RDW: 13.7 % (ref 11.1–15.7)
WBC: 4.9 10*3/uL (ref 4.0–10.0)

## 2014-06-11 LAB — CMP (CANCER CENTER ONLY)
ALT: 23 U/L (ref 10–47)
AST: 35 U/L (ref 11–38)
Albumin: 3.4 g/dL (ref 3.3–5.5)
Alkaline Phosphatase: 75 U/L (ref 26–84)
BUN, Bld: 9 mg/dL (ref 7–22)
CALCIUM: 9.1 mg/dL (ref 8.0–10.3)
CO2: 27 mEq/L (ref 18–33)
CREATININE: 0.7 mg/dL (ref 0.6–1.2)
Chloride: 100 mEq/L (ref 98–108)
Glucose, Bld: 221 mg/dL — ABNORMAL HIGH (ref 73–118)
Potassium: 4.2 mEq/L (ref 3.3–4.7)
Sodium: 139 mEq/L (ref 128–145)
Total Bilirubin: 1.3 mg/dl (ref 0.20–1.60)
Total Protein: 6.9 g/dL (ref 6.4–8.1)

## 2014-06-11 NOTE — Addendum Note (Signed)
Addended by: Burney Gauze R on: 06/11/2014 03:53 PM   Modules accepted: Orders

## 2014-06-11 NOTE — Progress Notes (Signed)
Hematology and Oncology Follow Up Visit  Matthew Lane [id number] [birth year] 64 y.o. 06/11/2014   Principle Diagnosis:   Metastatic prostate cancer-castrate resistant  Current Therapy:    Casodex 50 mg by mouth daily  Zometa 4 mg IV every 6 months     Interim History:  Mr.  Lane is back for a long awaited visit. We've not seen him since November 2014. He probably has some insurance issues. He does cannot afford to get here.  He sees Dr. Venia Minks of [facility] urology. Dr. Venia Minks has him on Casodex. I think he also is on Proscar. He wants Matthew Lane to take Zometa every 6 months.  Matthew Lane says that his last PSA with Dr. Venia Minks was 4.6. The last PSA that we have on him from November 2014 was 0.23.  He's not hurting. He's having no problems going to the bathroom. He's had no cough. He's had no abdominal pain. He's gained quite a bit of weight. He's been drinking a lot of soda.  He's had no rashes. He's had some leg swelling. He's had no joint problems.  He is working part-time for [employer].  Medications: Current outpatient prescriptions: aspirin 81 MG tablet, Take 81 mg by mouth daily., Disp: , Rfl: ;  bicalutamide (CASODEX) 50 MG tablet, Take 50 mg by mouth daily. , Disp: , Rfl: ;  Calcium-Vitamin D 150-100 MG-UNIT TABS, Take by mouth every morning., Disp: , Rfl: ;  Cyanocobalamin (VITAMIN B 12 PO), Take by mouth 2 (two) times daily., Disp: , Rfl: ;  finasteride (PROSCAR) 5 MG tablet, Take 5 mg by mouth., Disp: , Rfl:  multivitamin (THERAGRAN) per tablet, Take 1 tablet by mouth daily.  , Disp: , Rfl:   Allergies: No Known Allergies  Past Medical History, Surgical history, Social history, and Family History were reviewed and updated.  Review of Systems: As above  Physical Exam:  height is 5\' 9"  (1.753 m) and weight is 337 lb (152.862 kg). His oral temperature is 97.6 F (36.4 C). His blood pressure is 127/66 and his pulse is 66. His respiration is 20.   Obese white  Matthew Lane. Head and neck exam shows no ocular or oral lesions. There are no palpable cervical or supraclavicular lymph nodes. Lungs are clear bilaterally. Cardiac exam regular rate and rhythm with no murmurs, rubs or bruits. Abdomen is morbidly obese. He has good bowel sounds. There is no fluid wave. There is no palpable abdominal mass. There is no palpable liver or spleen tip. Back exam shows no tenderness over the spine, ribs or hips. Extremities shows no clubbing, cyanosis or edema. Has good range of motion of his joints. Has good muscle strength. Skin exam shows no rashes, ecchymoses or petechia. Neurological exam is nonfocal.  Lab Results  Component Value Date   WBC 4.9 06/11/2014   HGB 14.6 06/11/2014   HCT 41.2 06/11/2014   MCV 91 06/11/2014   PLT 84* 06/11/2014     Chemistry      Component Value Date/Time   NA 139 06/11/2014 1351   NA 139 04/04/2013 0930   K 4.2 06/11/2014 1351   K 4.0 04/04/2013 0930   CL 100 06/11/2014 1351   CL 107 04/04/2013 0930   CO2 27 06/11/2014 1351   CO2 26 04/04/2013 0930   BUN 9 06/11/2014 1351   BUN 16 04/04/2013 0930   CREATININE 0.7 06/11/2014 1351   CREATININE 0.83 04/04/2013 0930      Component Value Date/Time  CALCIUM 9.1 06/11/2014 1351   CALCIUM 9.0 04/04/2013 0930   ALKPHOS 75 06/11/2014 1351   ALKPHOS 86 04/04/2013 0930   AST 35 06/11/2014 1351   AST 30 04/04/2013 0930   ALT 23 06/11/2014 1351   ALT 18 04/04/2013 0930   BILITOT 1.30 06/11/2014 1351   BILITOT 0.9 04/04/2013 0930         Impression and Plan: Matthew Lane is 64 year old gentleman with castrate resistant prostate cancer. Again, we did not see him for over a year. It will be interesting to see what his PSA is here.  He is on Casodex. He is castrate level testosterone from what he tells me. He said that Dr.Mullins checked this and it was less than 10.  He will basically follow-up with Dr. Venia Minks. I am sure that he will have his PSA rise at some point this year. If  so, then I think we'll going to have to consider some form of intervention with chemotherapy. I suppose that we could use XTANDI. I suppose we could go back to [name]. He was on Zytiga.  It was good to see him again.  I spent about 35 minutes with him.  I told him that he can always come back to see [place] if there are any issues.   Volanda Napoleon, MD 1/11/20163:40 PM

## 2014-06-12 LAB — PSA: PSA: 3.5 ng/mL (ref <=4.00)

## 2014-06-13 ENCOUNTER — Telehealth: Payer: Self-pay | Admitting: *Deleted

## 2014-06-13 NOTE — Telephone Encounter (Addendum)
Patient aware of results  ----- Message from Volanda Napoleon, MD sent at 06/12/2014  7:23 PM EST ----- Call - PSA is only 3.50.  Not too bad!!  pete

## 2014-08-01 DIAGNOSIS — R399 Unspecified symptoms and signs involving the genitourinary system: Secondary | ICD-10-CM | POA: Diagnosis not present

## 2014-08-01 DIAGNOSIS — N390 Urinary tract infection, site not specified: Secondary | ICD-10-CM | POA: Diagnosis not present

## 2014-08-01 DIAGNOSIS — E291 Testicular hypofunction: Secondary | ICD-10-CM | POA: Diagnosis not present

## 2014-08-01 DIAGNOSIS — N400 Enlarged prostate without lower urinary tract symptoms: Secondary | ICD-10-CM | POA: Diagnosis not present

## 2014-08-09 DIAGNOSIS — N390 Urinary tract infection, site not specified: Secondary | ICD-10-CM | POA: Diagnosis not present

## 2014-08-20 IMAGING — CT CT CHEST W/ CM
2 of 6 series · 16 of 46 positions shown, 18 images · IV contrast (OMNIPAQUE)
Comparison: 12/11/2010.

CT CHEST

CLINICAL DATA: Metastatic prostate cancer, hormone therapy
ongoing, increased PSA, evaluate for progression.  History of colon
cancer status post resection.

CT CHEST, ABDOMEN AND PELVIS WITH CONTRAST
TECHNIQUE: Multidetector CT imaging of the chest, abdomen and
pelvis was performed following the standard protocol during bolus
administration of intravenous contrast.
Contrast: 100mL OMNIPAQUE IOHEXOL 300 MG/ML  SOLN

[Series 2: cap with st · axial · 0.81mm/px · z∈[-622,-62]mm · 13 of 129 slices shown, 15 images]
[im 9/129  soft-tissue]
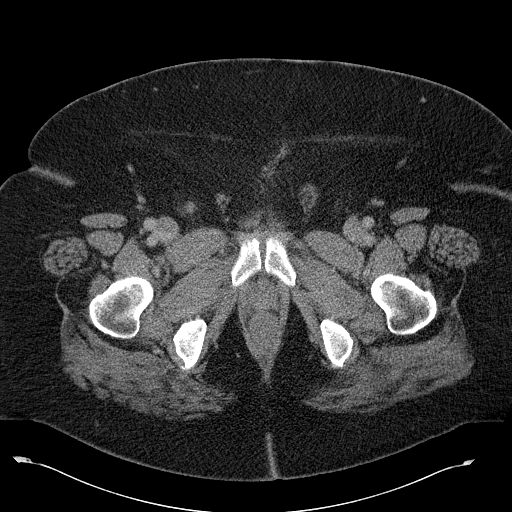
[im 9/129  bone]
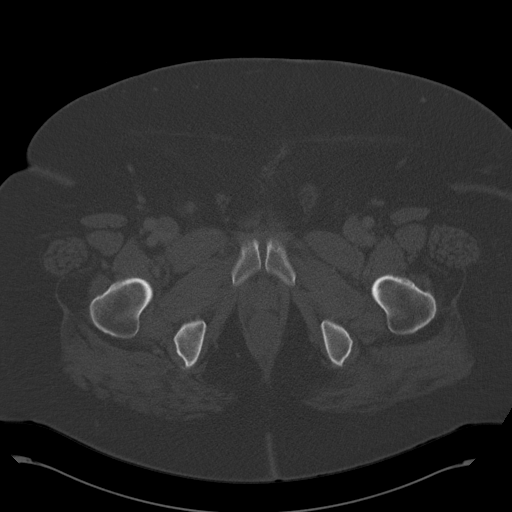
[im 17/129  soft-tissue]
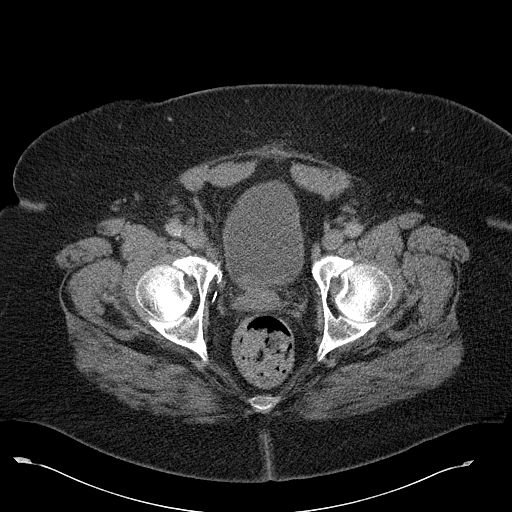
[im 25/129  soft-tissue]
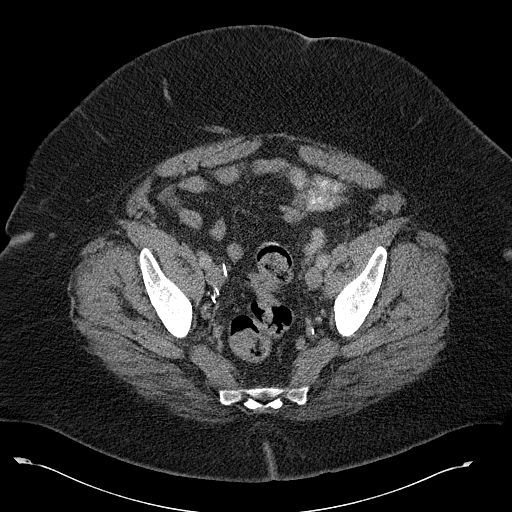
[im 41/129  soft-tissue]
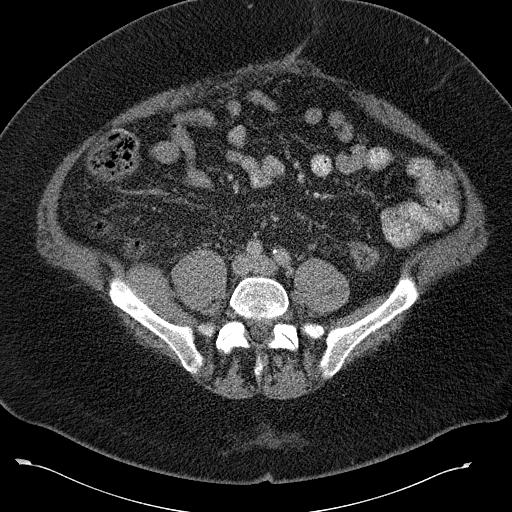
[im 49/129  soft-tissue]
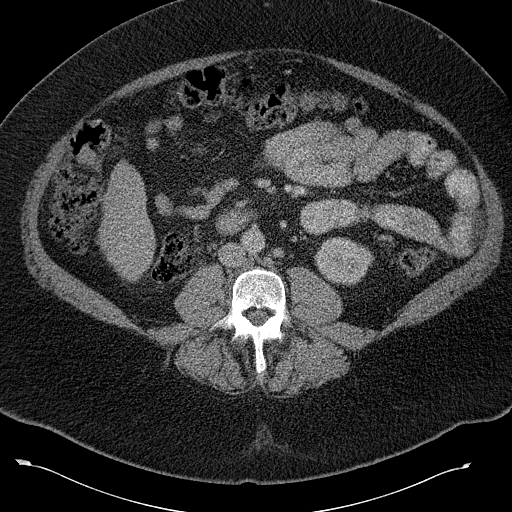
[im 57/129  soft-tissue]
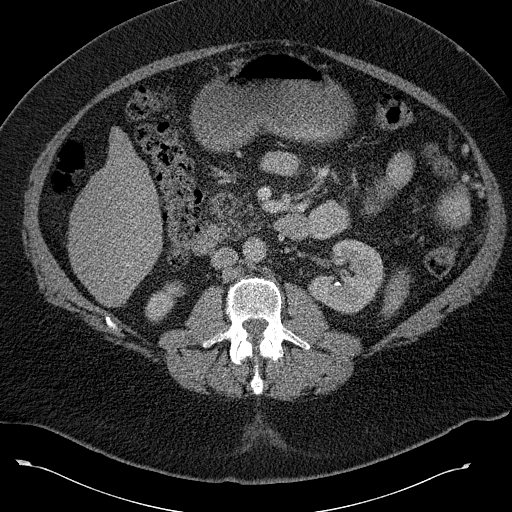
[im 65/129  soft-tissue]
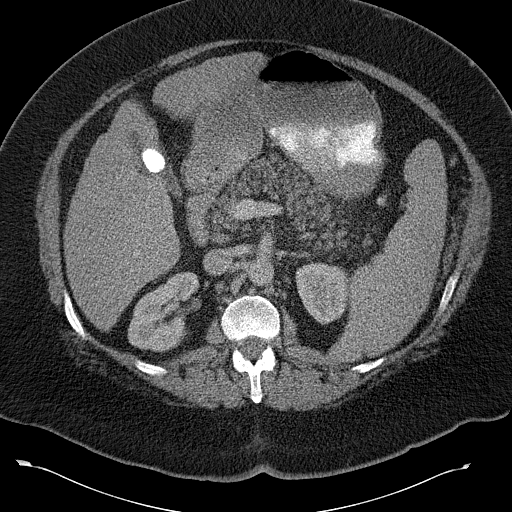
[im 73/129  soft-tissue]
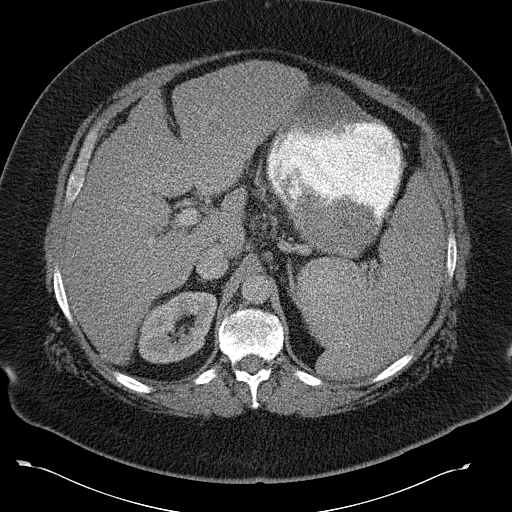
[im 81/129  soft-tissue]
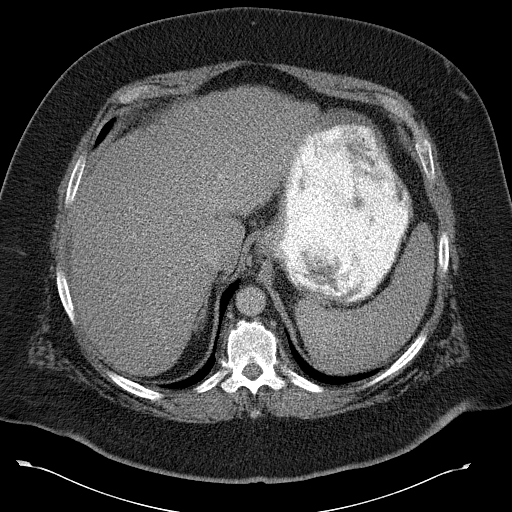
[im 81/129  bone]
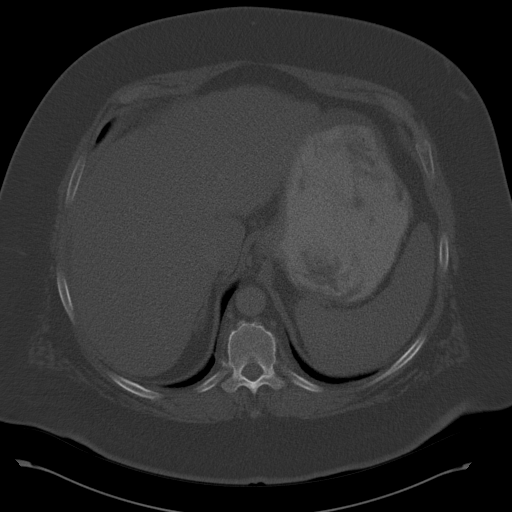
[im 89/129  soft-tissue]
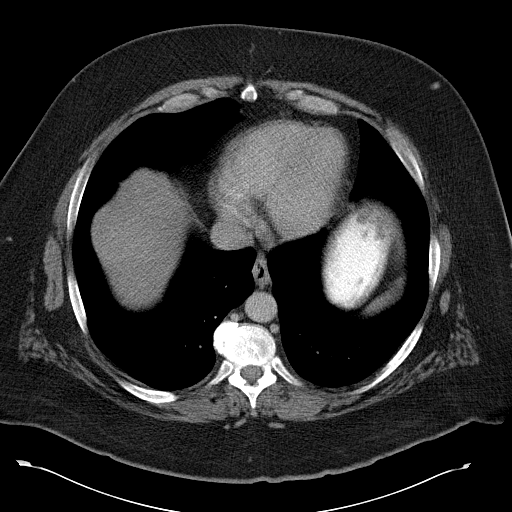
[im 105/129  soft-tissue]
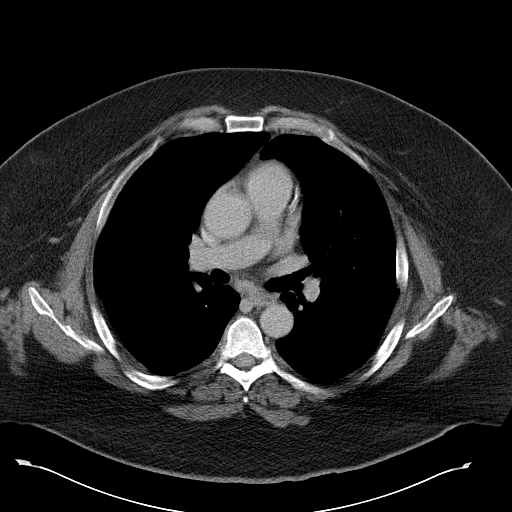
[im 113/129  soft-tissue]
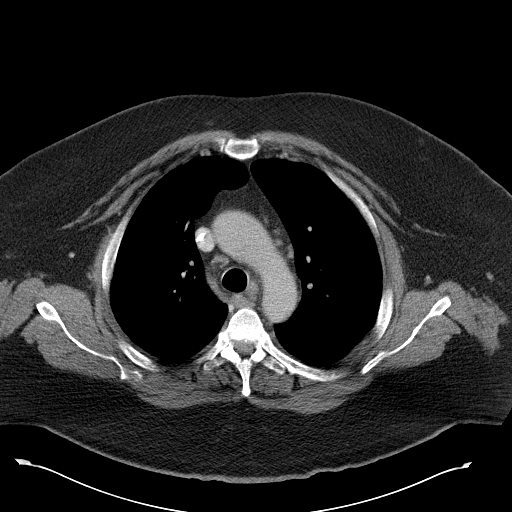
[im 121/129  soft-tissue]
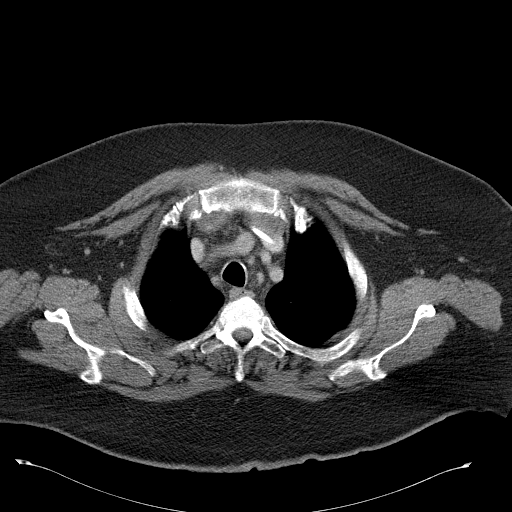

[Series 602: <mpr thick range> · coronal · 1.26mm/px · 3 of 123 slices shown]
[im 41/123  soft-tissue]
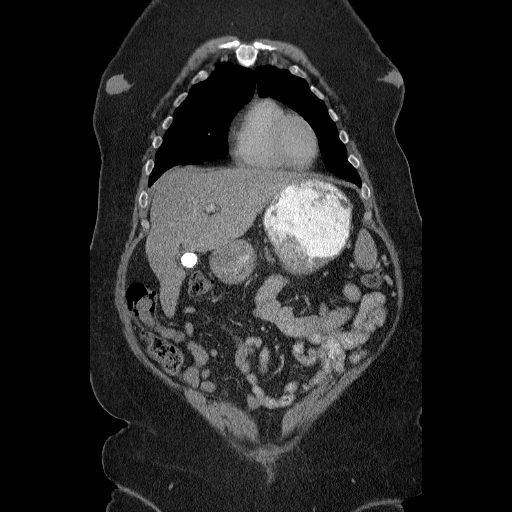
[im 55/123  soft-tissue]
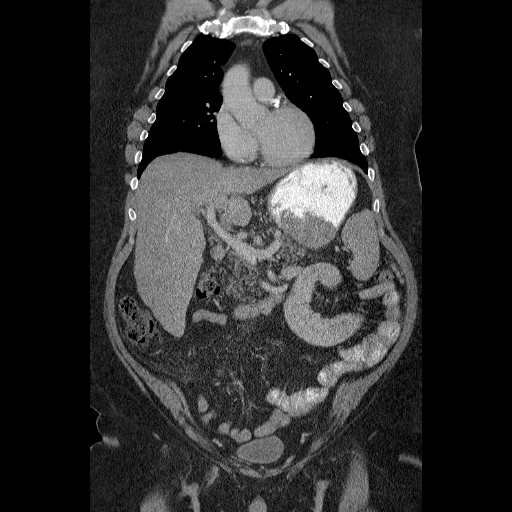
[im 68/123  soft-tissue]
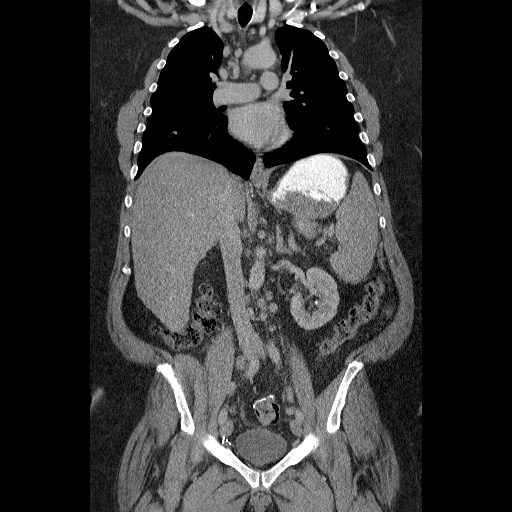

[16 of 46 positions shown; findings below may reference images not displayed]

FINDINGS: Lungs are clear.  No suspicious pulmonary nodules. No
pleural effusion or pneumothorax.

Visualized thyroid is unremarkable.

Heart is normal in size.  No pericardial effusion.  Coronary
atherosclerosis.

Again seen are numerous mediastinal lymph nodes measuring up to 12
mm short axis (series 2/image 14).  While similar to the prior
study, mild interval progression is suspected. Associated small
left supraclavicular nodes measuring up to 7 mm short axis (series
2/image 4).

Mild degenerative changes of the thoracic spine.
IMPRESSION: Stable osseous metastasis involving the inferior right scapula
(series 2/image 30).

Mildly enlarged mediastinal lymph nodes measuring up to 12 mm short
axis, mildly increased.  While this may be reactive, nodal
metastases are not excluded.

No evidence of pulmonary metastases.

CT ABDOMEN AND PELVIS
FINDINGS: Nodular hepatic contour, suggesting cirrhosis.  Mild
hepatic steatosis.

Splenomegaly, measuring 15.9 cm in transverse dimension.

Pancreas and adrenal glands within normal limits.

2.2 cm gallstone.  No associated inflammatory changes.  No
intrahepatic or extrahepatic ductal dilatation.

At least two nonobstructing left lower pole renal calculi measuring
up to 6 mm (series 2/image 72). Right kidney is unremarkable.  No
hydronephrosis.

No evidence of bowel obstruction.  Prior distal colonic resection
with anastomosis in the lower pelvis.

Atherosclerotic calcifications of the abdominal aorta and branch
vessels.

No abdominopelvic ascites.

Mild interval progression of retroperitoneal/pelvic
lymphadenopathy, including:
--1.4 cm short-axis retrocaval node (series 2/image 72)
--1.0 cm short-axis left para-aortic node (series 2/image 79)
--1.3 cm short-axis right common iliac node (series 2/image 72)
--1.5 cm short-axis left external iliac node (series 2/image 108)

Prior right pelvic lymphadenectomy.

Prostate is unremarkable.

Bladder is within normal limits.

Mild degenerative changes of the lumbar spine.  Destructive lesion
involving the right iliac bone with associated 4.9 x 6.6 cm soft
tissue mass (series 2/image 96) previously 4.4 x 5.3 cm.
IMPRESSION: Retroperitoneal/pelvic nodal metastases, measuring up to 1.5 short-
axis as described above, mildly increased.

Osseous metastasis involving the right iliac bone with associated
6.6 cm soft tissue mass, increased.

## 2014-09-17 ENCOUNTER — Other Ambulatory Visit (HOSPITAL_BASED_OUTPATIENT_CLINIC_OR_DEPARTMENT_OTHER): Payer: Medicare Other

## 2014-09-17 ENCOUNTER — Encounter: Payer: Self-pay | Admitting: Hematology & Oncology

## 2014-09-17 ENCOUNTER — Telehealth: Payer: Self-pay | Admitting: Hematology & Oncology

## 2014-09-17 ENCOUNTER — Ambulatory Visit (HOSPITAL_BASED_OUTPATIENT_CLINIC_OR_DEPARTMENT_OTHER): Payer: Medicare Other | Admitting: Hematology & Oncology

## 2014-09-17 VITALS — BP 134/65 | HR 69 | Temp 98.0°F | Resp 22 | Ht 69.0 in | Wt 345.0 lb

## 2014-09-17 DIAGNOSIS — C61 Malignant neoplasm of prostate: Secondary | ICD-10-CM | POA: Diagnosis not present

## 2014-09-17 DIAGNOSIS — C800 Disseminated malignant neoplasm, unspecified: Secondary | ICD-10-CM | POA: Diagnosis not present

## 2014-09-17 DIAGNOSIS — C7951 Secondary malignant neoplasm of bone: Secondary | ICD-10-CM | POA: Diagnosis not present

## 2014-09-17 LAB — CBC WITH DIFFERENTIAL (CANCER CENTER ONLY)
BASO#: 0.1 10*3/uL (ref 0.0–0.2)
BASO%: 0.8 % (ref 0.0–2.0)
EOS%: 6 % (ref 0.0–7.0)
Eosinophils Absolute: 0.4 10*3/uL (ref 0.0–0.5)
HEMATOCRIT: 40.3 % (ref 38.7–49.9)
HGB: 14.2 g/dL (ref 13.0–17.1)
LYMPH#: 2.1 10*3/uL (ref 0.9–3.3)
LYMPH%: 31.9 % (ref 14.0–48.0)
MCH: 32.6 pg (ref 28.0–33.4)
MCHC: 35.2 g/dL (ref 32.0–35.9)
MCV: 92 fL (ref 82–98)
MONO#: 0.8 10*3/uL (ref 0.1–0.9)
MONO%: 12.7 % (ref 0.0–13.0)
NEUT%: 48.6 % (ref 40.0–80.0)
NEUTROS ABS: 3.2 10*3/uL (ref 1.5–6.5)
PLATELETS: 100 10*3/uL — AB (ref 145–400)
RBC: 4.36 10*6/uL (ref 4.20–5.70)
RDW: 14 % (ref 11.1–15.7)
WBC: 6.5 10*3/uL (ref 4.0–10.0)

## 2014-09-17 LAB — CMP (CANCER CENTER ONLY)
ALT(SGPT): 25 U/L (ref 10–47)
AST: 37 U/L (ref 11–38)
Albumin: 3.6 g/dL (ref 3.3–5.5)
Alkaline Phosphatase: 100 U/L — ABNORMAL HIGH (ref 26–84)
BUN: 20 mg/dL (ref 7–22)
CALCIUM: 9.6 mg/dL (ref 8.0–10.3)
CO2: 28 mEq/L (ref 18–33)
Chloride: 104 mEq/L (ref 98–108)
Creat: 1 mg/dl (ref 0.6–1.2)
GLUCOSE: 114 mg/dL (ref 73–118)
POTASSIUM: 4.2 meq/L (ref 3.3–4.7)
Sodium: 150 mEq/L — ABNORMAL HIGH (ref 128–145)
Total Bilirubin: 1.1 mg/dl (ref 0.20–1.60)
Total Protein: 7.2 g/dL (ref 6.4–8.1)

## 2014-09-17 NOTE — Progress Notes (Signed)
Hematology and Oncology Follow Up Visit  Matthew Lane [id number] August 20, 1950 64 y.o. 09/17/2014   Principle Diagnosis:   Metastatic prostate cancer-castrate resistant  Current Therapy:    Casodex 50 mg by mouth daily  Zometa 4 mg IV every 6 months     Interim History:  Mr.  Matthew Lane is back for a  Follow-up. Last saw him back in January. He's been doing pretty well.  He's tolerating the Casodex well. He's had no problem bony pain.  When last saw him in January, his PSA was 3.5.  He's had no cough. There's been no shortness of breath. He's had no nausea or vomiting. He's had no fatigue. He's had no weight loss. He's had no leg swelling. He's had no rashes.   There's been no issues with going to the bathroom.  Medications:  Current outpatient prescriptions:  .  aspirin 81 MG tablet, Take 81 mg by mouth daily., Disp: , Rfl:  .  bicalutamide (CASODEX) 50 MG tablet, Take 50 mg by mouth daily. , Disp: , Rfl:  .  Calcium-Vitamin D 150-100 MG-UNIT TABS, Take by mouth every morning., Disp: , Rfl:  .  Cyanocobalamin (VITAMIN B 12 PO), Take by mouth 2 (two) times daily., Disp: , Rfl:  .  finasteride (PROSCAR) 5 MG tablet, Take 5 mg by mouth., Disp: , Rfl:  .  multivitamin (THERAGRAN) per tablet, Take 1 tablet by mouth daily.  , Disp: , Rfl:   Allergies: No Known Allergies  Past Medical History, Surgical history, Social history, and Family History were reviewed and updated.  Review of Systems: As above  Physical Exam:  height is 5\' 9"  (1.753 m) and weight is 345 lb (156.491 kg). His oral temperature is 98 F (36.7 C). His blood pressure is 134/65 and his pulse is 69. His respiration is 22.   Obese white Jemmott. Head and neck exam shows no ocular or oral lesions. There are no palpable cervical or supraclavicular lymph nodes. Lungs are clear bilaterally. Cardiac exam regular rate and rhythm with no murmurs, rubs or bruits. Abdomen is morbidly obese. He has good bowel sounds. There is no  fluid wave. There is no palpable abdominal mass. There is no palpable liver or spleen tip. Back exam shows no tenderness over the spine, ribs or hips. Extremities shows no clubbing, cyanosis or edema. Has good range of motion of his joints. Has good muscle strength. Skin exam shows no rashes, ecchymoses or petechia. Neurological exam is nonfocal.  Lab Results  Component Value Date   WBC 6.5 09/17/2014   HGB 14.2 09/17/2014   HCT 40.3 09/17/2014   MCV 92 09/17/2014   PLT 100* 09/17/2014     Chemistry      Component Value Date/Time   NA 150* 09/17/2014 1412   NA 139 04/04/2013 0930   K 4.2 09/17/2014 1412   K 4.0 04/04/2013 0930   CL 104 09/17/2014 1412   CL 107 04/04/2013 0930   CO2 28 09/17/2014 1412   CO2 26 04/04/2013 0930   BUN 20 09/17/2014 1412   BUN 16 04/04/2013 0930   CREATININE 1.0 09/17/2014 1412   CREATININE 0.83 04/04/2013 0930      Component Value Date/Time   CALCIUM 9.6 09/17/2014 1412   CALCIUM 9.0 04/04/2013 0930   ALKPHOS 100* 09/17/2014 1412   ALKPHOS 86 04/04/2013 0930   AST 37 09/17/2014 1412   AST 30 04/04/2013 0930   ALT 25 09/17/2014 1412   ALT 18 04/04/2013 0930  BILITOT 1.10 09/17/2014 1412   BILITOT 0.9 04/04/2013 0930         Impression and Plan: Matthew Lane is 64 year old gentleman with castrate resistant prostate cancer.    He sees we do pretty well. He will does not like having studies done.   He sees a Dealer in [facility]. He is a good job taking care of Mr. Dyas.    I have to believe that the urologist is giving him the gonadotropin agonist. I'll have to see what his testosterone level was.    I want to see him back in another 3 months. Was see him back, but we'll have to do the Zometa.  Volanda Napoleon, MD 4/18/20163:55 PM

## 2014-09-17 NOTE — Telephone Encounter (Signed)
Pt aware of 7-21 appointment

## 2014-09-18 LAB — PSA: PSA: 4.67 ng/mL — ABNORMAL HIGH (ref <=4.00)

## 2014-12-20 ENCOUNTER — Other Ambulatory Visit (HOSPITAL_BASED_OUTPATIENT_CLINIC_OR_DEPARTMENT_OTHER): Payer: Medicare Other

## 2014-12-20 ENCOUNTER — Encounter: Payer: Self-pay | Admitting: Family

## 2014-12-20 ENCOUNTER — Ambulatory Visit: Payer: Medicare Other

## 2014-12-20 ENCOUNTER — Ambulatory Visit (HOSPITAL_BASED_OUTPATIENT_CLINIC_OR_DEPARTMENT_OTHER): Payer: Medicare Other | Admitting: Family

## 2014-12-20 VITALS — BP 127/59 | HR 75 | Temp 97.9°F | Ht 69.0 in | Wt 350.0 lb

## 2014-12-20 DIAGNOSIS — C7951 Secondary malignant neoplasm of bone: Secondary | ICD-10-CM

## 2014-12-20 DIAGNOSIS — C61 Malignant neoplasm of prostate: Secondary | ICD-10-CM

## 2014-12-20 DIAGNOSIS — C800 Disseminated malignant neoplasm, unspecified: Secondary | ICD-10-CM | POA: Diagnosis not present

## 2014-12-20 LAB — CBC WITH DIFFERENTIAL (CANCER CENTER ONLY)
BASO#: 0 10*3/uL (ref 0.0–0.2)
BASO%: 0.9 % (ref 0.0–2.0)
EOS%: 8.2 % — AB (ref 0.0–7.0)
Eosinophils Absolute: 0.4 10*3/uL (ref 0.0–0.5)
HCT: 39.8 % (ref 38.7–49.9)
HGB: 14.4 g/dL (ref 13.0–17.1)
LYMPH#: 1.6 10*3/uL (ref 0.9–3.3)
LYMPH%: 33.7 % (ref 14.0–48.0)
MCH: 33.3 pg (ref 28.0–33.4)
MCHC: 36.2 g/dL — ABNORMAL HIGH (ref 32.0–35.9)
MCV: 92 fL (ref 82–98)
MONO#: 0.5 10*3/uL (ref 0.1–0.9)
MONO%: 11.6 % (ref 0.0–13.0)
NEUT#: 2.1 10*3/uL (ref 1.5–6.5)
NEUT%: 45.6 % (ref 40.0–80.0)
PLATELETS: 72 10*3/uL — AB (ref 145–400)
RBC: 4.32 10*6/uL (ref 4.20–5.70)
RDW: 13.7 % (ref 11.1–15.7)
WBC: 4.7 10*3/uL (ref 4.0–10.0)

## 2014-12-20 LAB — CMP (CANCER CENTER ONLY)
ALBUMIN: 3.5 g/dL (ref 3.3–5.5)
ALT(SGPT): 27 U/L (ref 10–47)
AST: 41 U/L — ABNORMAL HIGH (ref 11–38)
Alkaline Phosphatase: 86 U/L — ABNORMAL HIGH (ref 26–84)
BUN: 14 mg/dL (ref 7–22)
CO2: 25 mEq/L (ref 18–33)
CREATININE: 0.9 mg/dL (ref 0.6–1.2)
Calcium: 8.9 mg/dL (ref 8.0–10.3)
Chloride: 105 mEq/L (ref 98–108)
Glucose, Bld: 205 mg/dL — ABNORMAL HIGH (ref 73–118)
POTASSIUM: 4 meq/L (ref 3.3–4.7)
Sodium: 139 mEq/L (ref 128–145)
Total Bilirubin: 1.7 mg/dl — ABNORMAL HIGH (ref 0.20–1.60)
Total Protein: 6.7 g/dL (ref 6.4–8.1)

## 2014-12-20 NOTE — Progress Notes (Signed)
Hematology and Oncology Follow Up Visit  Matthew Lane [id number] [birth year] 64 y.o. 12/20/2014   Principle Diagnosis:  Metastatic prostate cancer-castrate resistant  Current Therapy:   Casodex 50 mg by mouth daily Zometa 4 mg IV every 6 months (has not started yet, waiting for secondary insurance per patient request)    Interim History:  Matthew Lane is here today for a follow-up. He is doing well and has no complaints at this time.  His most recent PSA in April was slightly elevated at 4.67. He is doing well on Casodex. He is also taking Proscar and has had no difficulty urinating.  He is followed by urology and last saw them in March. He was treated for a UTI which resolved with Cipro.  His testosterone in December was 8. We did recheck this today.  He has chronic pain in his knees but would like to avoid knee replacements until absolutely necessary. He has no c/o "bone" pain. No tenderness, numbness or tingling in his extremities.  He is eating healthy and has cut out snacking in order to lose weight. He is staying well hydrated. His weight is stable.  He has had no problem with infections. No fever, chills, n/v, cough, rash, dizziness, SOB, chest pain, palpitations, abdominal pain, changes in bowel or bladder habits. No blood in his urine or stool.  He was supposed to start getting Zometa every 6 months but due to finances he is waiting to start this until he gets a Consulting civil engineer.   Medications:    Medication List       This list is accurate as of: 12/20/14  2:43 PM.  Always use your most recent med list.               aspirin 81 MG tablet  Take 81 mg by mouth daily.     Calcium-Vitamin D 150-100 MG-UNIT Tabs  Take by mouth every morning.     CASODEX 50 MG tablet  Generic drug:  bicalutamide  Take 50 mg by mouth daily.     multivitamin per tablet  Take 1 tablet by mouth daily.     PROSCAR 5 MG tablet  Generic drug:  finasteride  Take 5 mg by mouth.     VITAMIN  B 12 PO  Take by mouth 2 (two) times daily.        Allergies: No Known Allergies  Past Medical History, Surgical history, Social history, and Family History were reviewed and updated.  Review of Systems: All other 10 point review of systems is negative.   Physical Exam:  height is 5\' 9"  (1.753 m) and weight is 350 lb (158.759 kg). His oral temperature is 97.9 F (36.6 C). His blood pressure is 127/59 and his pulse is 75.   Wt Readings from Last 3 Encounters:  12/20/14 350 lb (158.759 kg)  09/17/14 345 lb (156.491 kg)  06/11/14 337 lb (152.862 kg)    Ocular: Sclerae unicteric, pupils equal, round and reactive to light Ear-nose-throat: Oropharynx clear, dentition fair Lymphatic: No cervical or supraclavicular adenopathy Lungs no rales or rhonchi, good excursion bilaterally Heart regular rate and rhythm, no murmur appreciated Abd soft, nontender, positive bowel sounds MSK no focal spinal tenderness, no joint edema Neuro: non-focal, well-oriented, appropriate affect Breasts: Deferred  Lab Results  Component Value Date   WBC 4.7 12/20/2014   HGB 14.4 12/20/2014   HCT 39.8 12/20/2014   MCV 92 12/20/2014   PLT 72* 12/20/2014   No results found for: FERRITIN,  IRON, TIBC, UIBC, IRONPCTSAT Lab Results  Component Value Date   RBC 4.32 12/20/2014   No results found for: KPAFRELGTCHN, LAMBDASER, KAPLAMBRATIO No results found for: IGGSERUM, IGA, IGMSERUM No results found for: Odetta Pink, SPEI   Chemistry      Component Value Date/Time   NA 139 12/20/2014 1326   NA 139 04/04/2013 0930   K 4.0 12/20/2014 1326   K 4.0 04/04/2013 0930   CL 105 12/20/2014 1326   CL 107 04/04/2013 0930   CO2 25 12/20/2014 1326   CO2 26 04/04/2013 0930   BUN 14 12/20/2014 1326   BUN 16 04/04/2013 0930   CREATININE 0.9 12/20/2014 1326   CREATININE 0.83 04/04/2013 0930      Component Value Date/Time   CALCIUM 8.9 12/20/2014 1326    CALCIUM 9.0 04/04/2013 0930   ALKPHOS 86* 12/20/2014 1326   ALKPHOS 86 04/04/2013 0930   AST 41* 12/20/2014 1326   AST 30 04/04/2013 0930   ALT 27 12/20/2014 1326   ALT 18 04/04/2013 0930   BILITOT 1.70* 12/20/2014 1326   BILITOT 0.9 04/04/2013 0930     Impression and Plan: Matthew Lane is 64 year old gentleman with metastatic castrate resistant prostate cancer.He is doing well and asymptomatic at this time.  We will see what his testosterone level is today.  He would like to wait to start zometa until he gets a Consulting civil engineer.  We will plan to see him back in 3 months for labs and follow-up.  He knows to contact us with any questions or concerns. We can certainly see him sooner if need be.   Eliezer Bottom, NP 7/21/20162:43 PM

## 2014-12-21 LAB — TESTOSTERONE: TESTOSTERONE: 25 ng/dL — AB (ref 300–890)

## 2014-12-21 LAB — PSA: PSA: 16.05 ng/mL — ABNORMAL HIGH (ref <=4.00)

## 2014-12-24 ENCOUNTER — Telehealth: Payer: Self-pay | Admitting: Family

## 2014-12-24 ENCOUNTER — Other Ambulatory Visit: Payer: Self-pay | Admitting: Family

## 2014-12-24 NOTE — Telephone Encounter (Signed)
I spoke with Matthew Lane and went over his lab work with him. We will move his follow-up appointment up to in 3 weeks and do repeat labs at that time.

## 2015-01-18 ENCOUNTER — Encounter: Payer: Self-pay | Admitting: Hematology & Oncology

## 2015-01-18 ENCOUNTER — Other Ambulatory Visit (HOSPITAL_BASED_OUTPATIENT_CLINIC_OR_DEPARTMENT_OTHER): Payer: Medicare Other

## 2015-01-18 ENCOUNTER — Ambulatory Visit (HOSPITAL_BASED_OUTPATIENT_CLINIC_OR_DEPARTMENT_OTHER): Payer: Medicare Other | Admitting: Hematology & Oncology

## 2015-01-18 VITALS — BP 131/68 | HR 68 | Temp 97.5°F | Resp 18 | Ht 69.0 in | Wt 347.0 lb

## 2015-01-18 DIAGNOSIS — C61 Malignant neoplasm of prostate: Secondary | ICD-10-CM

## 2015-01-18 DIAGNOSIS — C800 Disseminated malignant neoplasm, unspecified: Secondary | ICD-10-CM | POA: Diagnosis not present

## 2015-01-18 LAB — COMPREHENSIVE METABOLIC PANEL (CC13)
ALK PHOS: 93 U/L (ref 40–150)
ALT: 24 U/L (ref 0–55)
ANION GAP: 9 meq/L (ref 3–11)
AST: 32 U/L (ref 5–34)
Albumin: 3.6 g/dL (ref 3.5–5.0)
BUN: 15.3 mg/dL (ref 7.0–26.0)
CALCIUM: 9.1 mg/dL (ref 8.4–10.4)
CO2: 23 mEq/L (ref 22–29)
Chloride: 108 mEq/L (ref 98–109)
Creatinine: 0.9 mg/dL (ref 0.7–1.3)
EGFR: >90 mL/min/{1.73_m2} (ref >90)
Glucose: 123 mg/dl (ref 70–140)
POTASSIUM: 4.3 meq/L (ref 3.5–5.1)
SODIUM: 140 meq/L (ref 136–145)
Total Bilirubin: 1.33 mg/dL — ABNORMAL HIGH (ref 0.20–1.20)
Total Protein: 6.6 g/dL (ref 6.4–8.3)

## 2015-01-18 LAB — CBC WITH DIFFERENTIAL (CANCER CENTER ONLY)
BASO#: 0 10*3/uL (ref 0.0–0.2)
BASO%: 0.8 % (ref 0.0–2.0)
EOS%: 8.1 % — ABNORMAL HIGH (ref 0.0–7.0)
Eosinophils Absolute: 0.4 10*3/uL (ref 0.0–0.5)
HCT: 40.6 % (ref 38.7–49.9)
HGB: 14.6 g/dL (ref 13.0–17.1)
LYMPH#: 1.8 10*3/uL (ref 0.9–3.3)
LYMPH%: 37.4 % (ref 14.0–48.0)
MCH: 33 pg (ref 28.0–33.4)
MCHC: 36 g/dL — ABNORMAL HIGH (ref 32.0–35.9)
MCV: 92 fL (ref 82–98)
MONO#: 0.6 10*3/uL (ref 0.1–0.9)
MONO%: 11.6 % (ref 0.0–13.0)
NEUT#: 2 10*3/uL (ref 1.5–6.5)
NEUT%: 42.1 % (ref 40.0–80.0)
PLATELETS: 64 10*3/uL — AB (ref 145–400)
RBC: 4.43 10*6/uL (ref 4.20–5.70)
RDW: 13.8 % (ref 11.1–15.7)
WBC: 4.8 10*3/uL (ref 4.0–10.0)

## 2015-01-18 NOTE — Progress Notes (Signed)
Hematology and Oncology Follow Up Visit  Matthew Lane [id number] 1951-01-15 64 y.o. 01/18/2015   Principle Diagnosis:   Metastatic prostate cancer-castrate resistant  Current Therapy:    Casodex 50 mg by mouth daily  Zometa 4 mg IV every 6 months     Interim History:  Mr.  Lane is back for follow-up. He is doing okay. He feels okay. He really has not had any issues from a point of view from his prostate cancer. He's had no bone pain. He is urinating okay.  Unfortunately, his PSA has been going up. When he was last seen in July, his PSA was up to 16.  He had been on slight tingle before. I think we got him off that because of some toxicity.  He's had no rashes. He's had no weight loss or weight gain. He's had no cough. He's had no bleeding.  He is working part-time. He enjoys this.  His appetite has been okay. He is diligent and watches what he eats.  Overall, his performance status is ECOG 1. There's been no issues with going to the bathroom.  Medications:  Current outpatient prescriptions:  .  aspirin 81 MG tablet, Take 81 mg by mouth daily., Disp: , Rfl:  .  bicalutamide (CASODEX) 50 MG tablet, Take 50 mg by mouth daily. , Disp: , Rfl:  .  Calcium-Vitamin D 150-100 MG-UNIT TABS, Take by mouth every morning., Disp: , Rfl:  .  Cyanocobalamin (VITAMIN B 12 PO), Take by mouth 2 (two) times daily., Disp: , Rfl:  .  finasteride (PROSCAR) 5 MG tablet, Take 5 mg by mouth., Disp: , Rfl:  .  multivitamin (THERAGRAN) per tablet, Take 1 tablet by mouth daily.  , Disp: , Rfl:   Allergies: No Known Allergies  Past Medical History, Surgical history, Social history, and Family History were reviewed and updated.  Review of Systems: As above  Physical Exam:  height is 5\' 9"  (1.753 m) and weight is 347 lb (157.398 kg). His oral temperature is 97.5 F (36.4 C). His blood pressure is 131/68 and his pulse is 68. His respiration is 18.   Obese white Jemmott. Head and neck exam shows no  ocular or oral lesions. There are no palpable cervical or supraclavicular lymph nodes. Lungs are clear bilaterally. Cardiac exam regular rate and rhythm with no murmurs, rubs or bruits. Abdomen is morbidly obese. He has good bowel sounds. There is no fluid wave. There is no palpable abdominal mass. There is no palpable liver or spleen tip. Back exam shows no tenderness over the spine, ribs or hips. Extremities shows no clubbing, cyanosis or edema. Has good range of motion of his joints. Has good muscle strength. Skin exam shows no rashes, ecchymoses or petechia. Neurological exam is nonfocal.  Lab Results  Component Value Date   WBC 4.8 01/18/2015   HGB 14.6 01/18/2015   HCT 40.6 01/18/2015   MCV 92 01/18/2015   PLT 64* 01/18/2015     Chemistry      Component Value Date/Time   NA 139 12/20/2014 1326   NA 139 04/04/2013 0930   K 4.0 12/20/2014 1326   K 4.0 04/04/2013 0930   CL 105 12/20/2014 1326   CL 107 04/04/2013 0930   CO2 25 12/20/2014 1326   CO2 26 04/04/2013 0930   BUN 14 12/20/2014 1326   BUN 16 04/04/2013 0930   CREATININE 0.9 12/20/2014 1326   CREATININE 0.83 04/04/2013 0930      Component Value Date/Time  CALCIUM 8.9 12/20/2014 1326   CALCIUM 9.0 04/04/2013 0930   ALKPHOS 86* 12/20/2014 1326   ALKPHOS 86 04/04/2013 0930   AST 41* 12/20/2014 1326   AST 30 04/04/2013 0930   ALT 27 12/20/2014 1326   ALT 18 04/04/2013 0930   BILITOT 1.70* 12/20/2014 1326   BILITOT 0.9 04/04/2013 0930         Impression and Plan: Matthew Lane is 64 year old gentleman with castrate resistant prostate cancer.    Again, the PSA had been going up. We will see what the PSA today shows. I think if it is higher, then we're going to have to make a change in his treatment. I would also recommend a bone scan and a CT scan to see if we can see any obvious measurable disease.  As far as therapy goes, we could consider ketoconazole.  I think we can probably consider Xgeva for him if we find  that there is issues with bones.  As far as his next appointment is concerned, I probably will base this on the results of his PSA.    Volanda Napoleon, MD 8/19/20162:49 PM

## 2015-01-19 LAB — PSA: PSA: 12.62 ng/mL — ABNORMAL HIGH (ref <=4.00)

## 2015-01-19 LAB — TESTOSTERONE: Testosterone: 26 ng/dL — ABNORMAL LOW (ref 300–890)

## 2015-02-22 DIAGNOSIS — C61 Malignant neoplasm of prostate: Secondary | ICD-10-CM | POA: Diagnosis not present

## 2015-03-21 ENCOUNTER — Ambulatory Visit: Payer: Medicare Other | Admitting: Hematology & Oncology

## 2015-03-21 ENCOUNTER — Other Ambulatory Visit: Payer: Medicare Other

## 2015-03-21 DIAGNOSIS — N390 Urinary tract infection, site not specified: Secondary | ICD-10-CM | POA: Diagnosis not present

## 2017-07-09 ENCOUNTER — Ambulatory Visit: Payer: Medicare Other | Admitting: Hematology & Oncology

## 2017-07-09 ENCOUNTER — Other Ambulatory Visit: Payer: Medicare Other
